# Patient Record
Sex: Male | Born: 1985 | Race: White | Hispanic: No | Marital: Single | Smoking: Current some day smoker
Health system: Southern US, Community
[De-identification: ages and names within clinical notes are randomized; demographics above are authoritative.]

## PROBLEM LIST (undated history)

## (undated) DIAGNOSIS — G5600 Carpal tunnel syndrome, unspecified upper limb: Secondary | ICD-10-CM

## (undated) DIAGNOSIS — M5412 Radiculopathy, cervical region: Secondary | ICD-10-CM

## (undated) DIAGNOSIS — M5416 Radiculopathy, lumbar region: Secondary | ICD-10-CM

## (undated) DIAGNOSIS — M549 Dorsalgia, unspecified: Secondary | ICD-10-CM

## (undated) HISTORY — DX: Carpal tunnel syndrome, unspecified upper limb: G56.00

## (undated) HISTORY — DX: Dorsalgia, unspecified: M54.9

## (undated) HISTORY — DX: Radiculopathy, lumbar region: M54.16

## (undated) HISTORY — DX: Radiculopathy, cervical region: M54.12

## (undated) HISTORY — PX: TONSILLECTOMY: SUR1361

---

## 2014-08-19 ENCOUNTER — Encounter (HOSPITAL_COMMUNITY): Payer: Self-pay | Admitting: Emergency Medicine

## 2014-08-19 ENCOUNTER — Emergency Department (HOSPITAL_COMMUNITY)
Admission: EM | Admit: 2014-08-19 | Discharge: 2014-08-19 | Disposition: A | Discharge status: Home / Self Care | Payer: BC Managed Care – PPO | Attending: Emergency Medicine | Admitting: Emergency Medicine

## 2014-08-19 DIAGNOSIS — H539 Unspecified visual disturbance: Secondary | ICD-10-CM | POA: Insufficient documentation

## 2014-08-19 DIAGNOSIS — R21 Rash and other nonspecific skin eruption: Secondary | ICD-10-CM | POA: Diagnosis not present

## 2014-08-19 DIAGNOSIS — Z720 Tobacco use: Secondary | ICD-10-CM | POA: Diagnosis not present

## 2014-08-19 DIAGNOSIS — R42 Dizziness and giddiness: Secondary | ICD-10-CM | POA: Insufficient documentation

## 2014-08-19 LAB — BASIC METABOLIC PANEL
Anion gap: 12 (ref 5–15)
BUN: 6 mg/dL (ref 6–23)
CO2: 25 meq/L (ref 19–32)
CREATININE: 0.65 mg/dL (ref 0.50–1.35)
Calcium: 8.9 mg/dL (ref 8.4–10.5)
Chloride: 102 mEq/L (ref 96–112)
GFR calc Af Amer: >90 mL/min (ref >90)
GFR calc non Af Amer: >90 mL/min (ref >90)
Glucose, Bld: 104 mg/dL — ABNORMAL HIGH (ref 70–99)
Potassium: 4 mEq/L (ref 3.7–5.3)
Sodium: 139 mEq/L (ref 137–147)

## 2014-08-19 LAB — CBC WITH DIFFERENTIAL/PLATELET
BASOS ABS: 0 10*3/uL (ref 0.0–0.1)
Basophils Relative: 1 % (ref 0–1)
Eosinophils Absolute: 0.2 10*3/uL (ref 0.0–0.7)
Eosinophils Relative: 2 % (ref 0–5)
HEMATOCRIT: 44.2 % (ref 39.0–52.0)
Hemoglobin: 14.8 g/dL (ref 13.0–17.0)
Lymphocytes Relative: 23 % (ref 12–46)
Lymphs Abs: 1.9 10*3/uL (ref 0.7–4.0)
MCH: 31 pg (ref 26.0–34.0)
MCHC: 33.5 g/dL (ref 30.0–36.0)
MCV: 92.5 fL (ref 78.0–100.0)
Monocytes Absolute: 0.6 10*3/uL (ref 0.1–1.0)
Monocytes Relative: 7 % (ref 3–12)
Neutro Abs: 5.6 10*3/uL (ref 1.7–7.7)
Neutrophils Relative %: 67 % (ref 43–77)
Platelets: 235 10*3/uL (ref 150–400)
RBC: 4.78 MIL/uL (ref 4.22–5.81)
RDW: 11.6 % (ref 11.5–15.5)
WBC: 8.4 10*3/uL (ref 4.0–10.5)

## 2014-08-19 LAB — URINALYSIS, ROUTINE W REFLEX MICROSCOPIC
Bilirubin Urine: NEGATIVE
GLUCOSE, UA: NEGATIVE mg/dL
KETONES UR: NEGATIVE mg/dL
Leukocytes, UA: NEGATIVE
Nitrite: NEGATIVE
Protein, ur: NEGATIVE mg/dL
Specific Gravity, Urine: 1.004 — ABNORMAL LOW (ref 1.005–1.030)
Urobilinogen, UA: 0.2 mg/dL (ref 0.0–1.0)
pH: 6 (ref 5.0–8.0)

## 2014-08-19 LAB — URINE MICROSCOPIC-ADD ON

## 2014-08-19 NOTE — ED Provider Notes (Signed)
CSN: [id number]     Arrival date & time 08/19/14  1811 History   First MD Initiated Contact with Patient 08/19/14 1936     Chief Complaint  Patient presents with  . Dizziness  . Rash  . Hypersensitivity Reaction     (Consider location/radiation/quality/duration/timing/severity/associated sxs/prior Treatment) HPI Kirk Williams is a 28 y.o. male with no significant past medical history who comes in complaining of "fatigue syndrome". Patient states that for the past 6 months he has been able to taste each one of his teeth individually and they all have a different taste. He reports not being able to think clearly. He reports the skin sometimes feels very warm. He does report he had a tooth removed 2 weeks ago and was concerned some of the filling material had enetered his blood stream and exacerbated his symptoms. He reports feeling dizzy and characterizes the feeling has "smoking marijuana or a really bad trip, like everything is out of focus". Says he has trouble reading signs that are far away if he can hold them close he has no problems reading. He also reports that his sense of smell and taste have increased tenfold. Denies any drug use other than cigarettes. Denies room spinning. Denies syncope, fevers, chest pain, shortness of breath, abdominal pain, N./V./C./D., numbness or weakness dysuria. He reports he has seen numerous doctors and none have been able to tell him exactly what the problem is. He reports having recently had [facility] CT [facility] [place] and was told the results were normal.   History reviewed. No pertinent past medical history. History reviewed. No pertinent past surgical history. History reviewed. No pertinent family history. History  Substance Use Topics  . Smoking status: Current Some Day Smoker  . Smokeless tobacco: Not on file  . Alcohol Use: No    Review of Systems  Constitutional: Negative for fever.  HENT: Negative for sore  throat.        Heightened sense of taste and smell  Eyes: Positive for visual disturbance.  Respiratory: Negative for shortness of breath.   Cardiovascular: Negative for chest pain.  Gastrointestinal: Negative for abdominal pain.  Endocrine: Negative for polyuria.  Genitourinary: Negative for dysuria.  Skin: Negative for rash.  Neurological: Positive for dizziness. Negative for headaches.      Allergies  Review of patient's allergies indicates no known allergies.  Home Medications   Prior to Admission medications   Not on File   BP 145/83  Pulse 88  Temp(Src) 98.3 F (36.8 C) (Oral)  Resp 16  SpO2 100% Physical Exam  Nursing note and vitals reviewed. Constitutional: He is oriented to person, place, and time. He appears well-developed and well-nourished.  HENT:  Head: Normocephalic and atraumatic.  Right Ear: External ear normal.  Left Ear: External ear normal.  Mouth/Throat: Oropharynx is clear and moist. No oropharyngeal exudate.  No evidence of dental infection. No fluctuance or evidence of a drainable abscess. No sign of Ludwig or Vincent angina   Eyes: Conjunctivae are normal. Pupils are equal, round, and reactive to light. Right eye exhibits no discharge. Left eye exhibits no discharge. No scleral icterus.  Neck: Normal range of motion. Neck supple. No thyromegaly present.  Cardiovascular: Normal rate, regular rhythm, normal heart sounds and intact distal pulses.  Exam reveals no gallop and no friction rub.   No murmur heard. Pulmonary/Chest: Effort normal and breath sounds normal. No respiratory distress. He has no wheezes. He has no rales.  Abdominal: Soft. He exhibits no distension and no mass. There is no tenderness. There is no rebound and no guarding.  Musculoskeletal: He exhibits no edema and no tenderness.  Lymphadenopathy:    He has no cervical adenopathy.  Neurological: He is alert and oriented to person, place, and time.  Cranial Nerves II-XII grossly  intact. No focal neuro deficits. Pt ambulates independently with no difficulty or ataxia. Mentation is appropriate with goal oriented speech.  Skin: Skin is warm and dry. No rash noted.  No rashes appreciated  Psychiatric: He has a normal mood and affect.  Pt seems anxious as to why no one can tell him what is going on.     ED Course  Procedures (including critical care time) Labs Review Labs Reviewed - No data to display  Imaging Review No results found.   EKG Interpretation None      MDM  Vitals stable - WNL -afebrile Pt resting comfortably in ED. PE not concerning for any acute or emergent pathology. Labwork essentially normal and noncontributory. EKG not concerning. Visual acuity is baseline and is not concerning. Symptomology likely has an anxiety component. No identifiable emergent cause for his current symptoms. Provided reassurance and encouraged patient to followup with primary care for other concerns. Pt reports good f/u with primary care in [place]. Discussed f/u with PCP and return precautions, pt very amenable to plan. Patient stable, in good condition and is appropriate for discharge. Prior to patient discharge I discussed patient presentation ED course with Dr. Loretha StaplerWofford   Final diagnoses:  Dizziness        Sharlene MottsBenjamin W Eliot Popper, PA-C 08/19/14 2353

## 2014-08-19 NOTE — Discharge Instructions (Signed)
You were evaluated in the ED today for your symptoms of dizziness, rashes and hypersensitivity. There were no emergent source is identified for your symptoms. He may followup with your primary care for any other concerns. If you begin to experience fevers, chest pain, shortness of breath, abdominal pain new numbness or weakness please return to ED for further evaluation. Dizziness Dizziness is a common problem. It is a feeling of unsteadiness or light-headedness. You may feel like you are about to faint. Dizziness can lead to injury if you stumble or fall. A person of any age group can suffer from dizziness, but dizziness is more common in older adults. CAUSES  Dizziness can be caused by many different things, including:  Middle ear problems.  Standing for too long.  Infections.  An allergic reaction.  Aging.  An emotional response to something, such as the sight of blood.  Side effects of medicines.  Tiredness.  Problems with circulation or blood pressure.  Excessive use of alcohol or medicines, or illegal drug use.  Breathing too fast (hyperventilation).  An irregular heart rhythm (arrhythmia).  A low red blood cell count (anemia).  Pregnancy.  Vomiting, diarrhea, fever, or other illnesses that cause body fluid loss (dehydration).  Diseases or conditions such as Parkinson's disease, high blood pressure (hypertension), diabetes, and thyroid problems.  Exposure to extreme heat. DIAGNOSIS  Your health care provider will ask about your symptoms, perform a physical exam, and perform an electrocardiogram (ECG) to record the electrical activity of your heart. Your health care provider may also perform other heart or blood tests to determine the cause of your dizziness. These may include:  Transthoracic echocardiogram (TTE). During echocardiography, sound waves are used to evaluate how blood flows through your heart.  Transesophageal echocardiogram (TEE).  Cardiac monitoring.  This allows your health care provider to monitor your heart rate and rhythm in real time.  Holter monitor. This is a portable device that records your heartbeat and can help diagnose heart arrhythmias. It allows your health care provider to track your heart activity for several days if needed.  Stress tests by exercise or by giving medicine that makes the heart beat faster. TREATMENT  Treatment of dizziness depends on the cause of your symptoms and can vary greatly. HOME CARE INSTRUCTIONS   Drink enough fluids to keep your urine clear or pale yellow. This is especially important in very hot weather. In older adults, it is also important in cold weather.  Take your medicine exactly as directed if your dizziness is caused by medicines. When taking blood pressure medicines, it is especially important to get up slowly.  Rise slowly from chairs and steady yourself until you feel okay.  In the morning, first sit up on the side of the bed. When you feel okay, stand slowly while holding onto something until you know your balance is fine.  Move your legs often if you need to stand in one place for a long time. Tighten and relax your muscles in your legs while standing.  Have someone stay with you for 1-2 days if dizziness continues to be a problem. Do this until you feel you are well enough to stay alone. Have the person call your health care provider if he or she notices changes in you that are concerning.  Do not drive or use heavy machinery if you feel dizzy.  Do not drink alcohol. SEEK IMMEDIATE MEDICAL CARE IF:   Your dizziness or light-headedness gets worse.  You feel nauseous or  vomit.  You have problems talking, walking, or using your arms, hands, or legs.  You feel weak.  You are not thinking clearly or you have trouble forming sentences. It may take a friend or family member to notice this.  You have chest pain, abdominal pain, shortness of breath, or sweating.  Your vision  changes.  You notice any bleeding.  You have side effects from medicine that seems to be getting worse rather than better. MAKE SURE YOU:   Understand these instructions.  Will watch your condition.  Will get help right away if you are not doing well or get worse. Document Released: 04/10/2001 Document Revised: 10/20/2013 Document Reviewed: 05/04/2011 [facility] Patient Information 2015 [employer], [place]. This information is not intended to replace advice given to you by your health care provider. Make sure you discuss any questions you have with your health care provider.    Emergency Department Resource Guide 1) Find a Doctor and Pay Out of Pocket Although you won't have to find out who is covered by your insurance plan, it is a good idea to ask around and get recommendations. You will then need to call the office and see if the doctor you have chosen will accept you as a new patient and what types of options they offer for patients who are self-pay. Some doctors offer discounts or will set up payment plans for their patients who do not have insurance, but you will need to ask so you aren't surprised when you get to your appointment.  2) Contact Your Local Health Department Not all health departments have doctors that can see patients for sick visits, but many do, so it is worth a call to see if yours does. If you don't know where your local health department is, you can check in your phone book. The CDC also has a tool to help you locate your state's health department, and many state websites also have listings of all of their local health departments.  3) Find a Walk-in Clinic If your illness is not likely to be very severe or complicated, you may want to try a walk in clinic. These are popping up all over the country in pharmacies, drugstores, and shopping centers. They're usually staffed by nurse practitioners or physician assistants that have been trained to treat common illnesses and  complaints. They're usually fairly quick and inexpensive. However, if you have serious medical issues or chronic medical problems, these are probably not your best option.  No Primary Care Doctor: - Call Health Connect at  [phone] - they can help you locate a primary care doctor that  accepts your insurance, provides certain services, etc. - Physician Referral Service- [phone]  Chronic Pain Problems: Organization         Address  Phone   Notes  [facility]  [phone] Patients need to be referred by their primary care doctor.   Medication Assistance: Organization         Address  Phone   Notes  [facility] Medication [facility] [address] [place] [place], [place] [address] [phone] --Must be a resident of [facility] -- Must have NO insurance coverage whatsoever (no Medicaid/ Medicare, etc.) -- The pt. MUST have a primary care doctor that directs their care regularly and follows them in the community   MedAssist  [phone]   [employer]  [phone]    Agencies that provide inexpensive medical care: Organization  Address  Phone   Notes  [place]  [phone]   [facility]    [phone]   [facility] [place], [place] [address] [phone]   [place] [address] [place] [place] [phone]   Planned Parenthood    [phone]   [facility]    [phone]   [place] [address], [place] Phone:  [phone], Fax:  [phone] Hours of Operation:  9 am - 6 pm, M-F.  Also accepts Medicaid/Medicare and self-pay.  [facility] for [address], [address], [place] Phone: [phone], Fax: [phone]. Hours of Operation:  8:30 am - 5:30 pm, M-F.  Also accepts Medicaid and self-pay.  [facility] [place] [address], [place] Phone: [phone]   [place], [place], [place] [phone], Ext. [phone] Mondays & Thursdays: 7-9 AM.  First 15 patients are seen on a first come, first serve basis.    [place] Providers:  Organization         Address  Phone   Notes  [facility] [address], [place] [phone] Also accepts self-pay patients.  [facility] [id number] [place], [place]  [phone]   [place], [address], [place] [phone]   [facility] [address] [place] [phone]   Lucianne Lei [address], [place]   [phone] Only accepts [place] Access [place] patients after they have their name applied to their card.   Self-Pay (no insurance) in [facility]:  Organization         Address  Phone   Notes  Sickle Cell Patients, [facility] [place] [phone]   [facility] [place] [phone]   [facility] [place]  [place], [place], [place] [phone]   [facility]/Dr. Osei-Bonsu  [address] [place] or [address], [place] [phone] Phone number for both [place] and [place] locations is the same.  Urgent Medical and [facility] [address], [place] [phone]   [facility] [address], [place] or [address] [phone]   [facility] [address], [place] [phone], phone; [phone], fax Sees patients 1st and 3rd Saturday of every month.  Must not qualify for public or private insurance (i.e. Medicaid, Medicare, [place] Health Choice, Veterans' Benefits)  Household income should be no more than 200% of the poverty level  The clinic cannot treat you if you are pregnant or think you are pregnant  Sexually transmitted diseases are not treated at the clinic.    Dental Care: Organization         Address  Phone  Notes  [facility] Department of [facility] [place] [phone] Accepts children up to age 57 who are enrolled in [place] or [place]; pregnant women with a Medicaid card; and children who have applied for Medicaid or [place] Health Choice, but were declined, whose parents can pay a reduced fee at time  of service.  [facility] Department of [facility]  [address], [place] [phone] Accepts children up to age 45 who are enrolled in [place] or [place]; pregnant women with a Medicaid card; and children who have applied for Medicaid or [place] Health Choice, but were declined, whose parents can pay a reduced fee at time of service.  [place] Adult Dental Access PROGRAM  [place] [phone] Patients are seen by appointment only. Walk-ins are not accepted. [place] will see patients 75 years of age and older. Monday - Tuesday (8am-5pm) Most Wednesdays (8:30-5pm) $30 per visit, cash only  [facility] Adult Dental Access PROGRAM  [address], [facility] [phone] Patients are seen by appointment only. Walk-ins are not accepted. [place] will see patients 10 years of age and older. One Wednesday Evening (Monthly: Volunteer Based).  $30 per visit, cash only  [place]  [phone] for adults; Children under age 74, call Graduate Pediatric Dentistry at [phone]. Children aged 101-14, please call [phone] to request a pediatric application.  Dental services are provided in all areas of dental care including fillings, crowns and bridges, complete and partial dentures, implants, gum treatment, root canals, and extractions. Preventive care is  also provided. Treatment is provided to both adults and children. Patients are selected via a lottery and there is often a waiting list.   [facility] [address] [place]  [phone] [email or web address]   [facility] [address] [place], [place] [phone], Ext. [phone] Second and Fourth Thursday of each month, opens at 6:30 AM; Clinic ends at 9 AM.  Patients are seen on a first-come first-served basis, and a limited number are seen during each clinic.   [facility]  [address] Hillard Danker [place], [place] [phone]   Eligibility Requirements You must have lived in [place], [place], or [place] counties for at least the last three months.   You cannot be eligible for state or federal sponsored [employer], including [employer], [place], or [employer].   You generally cannot be eligible for healthcare insurance through your employer.    How to apply: Eligibility screenings are held every Tuesday and Wednesday afternoon from 1:00 pm until 4:00 pm. You do not need an appointment for the interview!  [facility] [address] [place], [place]   [place]  [place] Department  [place]  [phone]    Behavioral Health Resources in the Community: Intensive Outpatient Programs Organization         Address  Phone  Notes  [place]. [address] [place], [place] [phone]   [facility] Outpatient [address], [place], [place]   ADS: Alcohol & Drug Svcs [address] [place], [place]   [place] [address]  [place], [place] or [phone]   Substance Abuse Resources Organization         Address  Phone  Notes  Alcohol and Drug Services  [phone]   [place]  [phone]   [facility] [place]   Chinita Pester  [phone]   Residential & Outpatient Substance Abuse Program  [phone]   Psychological Services Organization         Address  Phone  Notes  [facility]  [phone]- [phone]   [facility]  [phone]- [phone]   [facility] [address], [place] [phone] or [phone]    Mobile Crisis Teams Organization         Address  Phone  Notes  [facility], Mobile Crisis Care Unit  [phone]   [facility]  [address] [place], [place] [phone]   Doristine LocksSharon DeEsch [address] [place] [phone]    Self-Help/Support Groups Organization         Address  Phone             Notes  Mental Health Assoc. of [place] - variety of support groups  [phone] Call for more information  Narcotics Anonymous (NA), [facility] [address], [place]  2 meetings at this location   Statisticianesidential Treatment Programs Organization         Address  Phone  Notes  [facility] [address],    [place]  [phone]   [facility]  [address], [place] [phone], [place], [place] [phone]   [facility] [address], [place] [phone] Admissions: 8am-3pm M-F  [facility] [address]    [place], [place] [phone]   [facility] [address], [place], [place] [phone]   [facility] [address]  [place], [place] [phone]   [facility] - Intensive Outpatient [address], [place], [place] [phone]   [facility] ([facility] Recovery [facility] [address]  [place], [place] [phone] or [phone]   [facility] Services (RTS) [address] [place], [place] [phone] Accepts Medicaid  [facility] [address]  [place] [phone]  Substance Abuse/Addiction Treatment   [facility] Behavioral Health Resources Organization         Address  Phone  Notes  [facility]  [phone]) [phone]   Angie FavaJulie Brannon, PhD [address], [address] [place], [place]   [phone] or [phone]   [facility] [place]   [address] [place], [place] [phone]   [facility] [address], [place], [place] [phone] Insurance/Medicaid/sponsorship through [facility]  [facility] Families [address]                                    [place], [place] [phone] Therapy/tele-psych/case  [facility] [address]   [place], [place] [phone]    Dr. Lolly MustacheArfeen  [phone]   [facility] [place]  [employer] [facility] 1) [address], [place] 2) [address], [place] 3)  [address], [place] [phone]) [phone]  [phone]   [facility] Child Abuse Hotline [phone] or [phone](336) [phone] (After Hours)

## 2014-08-19 NOTE — ED Notes (Addendum)
Bizarre symptoms - fatique syndrome, taste teeth, dizzy spills, sometimes wanting to pass out. Gets confuse sometimes. Tooth extraction x 2 weeks ago. Lt. Lower back tooth extraction. Rash/welts, callas from ? Numbing medicine. Etc. Unexplained. Gets red blotches on body.  Went to pcp x 3 weeks ago for extensive work up.

## 2014-08-20 NOTE — ED Provider Notes (Signed)
Medical screening examination/treatment/procedure(s) were performed by non-physician practitioner and as supervising physician I was immediately available for consultation/collaboration.   EKG Interpretation None        Warnell Foresterrey Ovadia Lopp, MD 08/20/14 0003

## 2020-02-25 ENCOUNTER — Ambulatory Visit (INDEPENDENT_AMBULATORY_CARE_PROVIDER_SITE_OTHER): Payer: No Typology Code available for payment source | Admitting: Osteopathic Medicine

## 2020-02-25 ENCOUNTER — Other Ambulatory Visit: Payer: Self-pay

## 2020-02-25 ENCOUNTER — Encounter: Payer: Self-pay | Admitting: Osteopathic Medicine

## 2020-02-25 VITALS — BP 154/102 | HR 108 | Temp 98.1°F | Ht 70.0 in | Wt 176.0 lb

## 2020-02-25 DIAGNOSIS — R202 Paresthesia of skin: Secondary | ICD-10-CM | POA: Diagnosis not present

## 2020-02-25 DIAGNOSIS — M5410 Radiculopathy, site unspecified: Secondary | ICD-10-CM | POA: Diagnosis not present

## 2020-02-25 DIAGNOSIS — E559 Vitamin D deficiency, unspecified: Secondary | ICD-10-CM

## 2020-02-25 DIAGNOSIS — E538 Deficiency of other specified B group vitamins: Secondary | ICD-10-CM | POA: Diagnosis not present

## 2020-02-25 LAB — CBC
HCT: 46.9 % (ref 38.5–50.0)
Hemoglobin: 15.9 g/dL (ref 13.2–17.1)
MCH: 30.8 pg (ref 27.0–33.0)
MCHC: 33.9 g/dL (ref 32.0–36.0)
MCV: 90.9 fL (ref 80.0–100.0)
MPV: 12.8 fL — ABNORMAL HIGH (ref 7.5–12.5)
Platelets: 193 10*3/uL (ref 140–400)
RBC: 5.16 10*6/uL (ref 4.20–5.80)
RDW: 11.5 % (ref 11.0–15.0)
WBC: 7.8 10*3/uL (ref 3.8–10.8)

## 2020-02-25 LAB — VITAMIN B12: Vitamin B-12: 459 pg/mL (ref 200–1100)

## 2020-02-25 LAB — COMPLETE METABOLIC PANEL WITH GFR
AG Ratio: 2.3 (calc) (ref 1.0–2.5)
ALT: 28 U/L (ref 9–46)
AST: 18 U/L (ref 10–40)
Albumin: 4.5 g/dL (ref 3.6–5.1)
Alkaline phosphatase (APISO): 55 U/L (ref 36–130)
BUN: 8 mg/dL (ref 7–25)
CO2: 27 mmol/L (ref 20–32)
Calcium: 9.7 mg/dL (ref 8.6–10.3)
Chloride: 105 mmol/L (ref 98–110)
Creat: 0.85 mg/dL (ref 0.60–1.35)
GFR, Est African American: 130 mL/min/{1.73_m2} (ref >=60)
GFR, Est Non African American: 112 mL/min/{1.73_m2} (ref >=60)
Globulin: 2 g/dL (calc) (ref 1.9–3.7)
Glucose, Bld: 105 mg/dL — ABNORMAL HIGH (ref 65–99)
Potassium: 3.6 mmol/L (ref 3.5–5.3)
Sodium: 142 mmol/L (ref 135–146)
Total Bilirubin: 0.4 mg/dL (ref 0.2–1.2)
Total Protein: 6.5 g/dL (ref 6.1–8.1)

## 2020-02-25 LAB — TSH: TSH: 2.11 mIU/L (ref 0.40–4.50)

## 2020-02-25 LAB — VITAMIN D 25 HYDROXY (VIT D DEFICIENCY, FRACTURES): Vit D, 25-Hydroxy: 24 ng/mL — ABNORMAL LOW (ref 30–100)

## 2020-02-25 NOTE — Patient Instructions (Addendum)
   Will look through records and try to get a good summary of everything!   Consider second opinion from different neurologist - will await imaging records first so we have a better idea of this MS situation!  Dr. Karie Schwalbe can see you for trigger point injections. If this plus the tramadol is helping, great! If not, will get second opinion from spine specialist.    Will recheck labs today to follow up on B12 levels, since deficiency can cause some issues with numbness.

## 2020-02-25 NOTE — Progress Notes (Signed)
Kirk Williams is a 34 y.o. male who presents to  [place] at [facility]  today, 02/26/20, seeking care for the following: . NEW TO ESTABLISH  . MSK/Neuro history - need to review records. Pt has hx back pain, degenerative disc disease. Does really well with intermittent use of Tramadol and trigger point injections.      ASSESSMENT & PLAN with other pertinent history/findings:  The primary encounter diagnosis was Radiculopathy, unspecified spinal region. Diagnoses of Paresthesia, B12 deficiency, and Vitamin D deficiency were also pertinent to this visit.  Will review records - pt has concerns about previous inaccurate medical documentation. Care Everywhere reviewed for [facility] and [facility] records, MRI reports. Neuro records - pt brought paper copies, will need time to review these - some concern for MS, pt not on medication intervention for this so not sure what neurology plan/diagnosis truly is, would consider getting second opinion. I will review records and consult w/ Dr T - consider MRI T-spine, L-spine but this is non-urgent. OK to continue Tramadol prn.    Patient Instructions   Will look through records and try to get a good summary of everything!   Consider second opinion from different neurologist - will await imaging records first so we have a better idea of this MS situation!  Dr. Darene Lamer can see you for trigger point injections. If this plus the tramadol is helping, great! If not, will get second opinion from spine specialist.    Will recheck labs today to follow up on B12 levels, since deficiency can cause some issues with numbness.          Orders Placed This Encounter  Procedures  . CBC  . COMPLETE METABOLIC PANEL WITH GFR  . TSH  . Vitamin B12  . VITAMIN D 25 Hydroxy (Vit-D Deficiency, Fractures)    No orders of the defined types were placed in this encounter.      Follow-up instructions: Return for VISIT WITH SPORTS  MEDICINE FOR TRIGGER POINT INJECTIONS / OTHER. VISIT W/ DR. A 3 MOS TO FOLLOW-UP.                                         BP (!) 154/102 (BP Location: Left Arm, Patient Position: Sitting, Cuff Size: Normal)   Pulse (!) 108   Temp 98.1 F (36.7 C) (Oral)   Ht 5\' 10"  (1.778 m)   Wt 176 lb (79.8 kg)   BMI 25.25 kg/m   Current Meds  Medication Sig  . traMADol (ULTRAM) 50 MG tablet Take by mouth.    Results for orders placed or performed in visit on 02/25/20 (from the past 72 hour(s))  CBC     Status: Abnormal   Collection Time: 02/25/20 12:24 PM  Result Value Ref Range   WBC 7.8 3.8 - 10.8 Thousand/uL   RBC 5.16 4.20 - 5.80 Million/uL   Hemoglobin 15.9 13.2 - 17.1 g/dL   HCT 46.9 38.5 - 50.0 %   MCV 90.9 80.0 - 100.0 fL   MCH 30.8 27.0 - 33.0 pg   MCHC 33.9 32.0 - 36.0 g/dL   RDW 11.5 11.0 - 15.0 %   Platelets 193 140 - 400 Thousand/uL   MPV 12.8 (H) 7.5 - 12.5 fL  COMPLETE METABOLIC PANEL WITH GFR     Status: Abnormal   Collection Time: 02/25/20 12:24 PM  Result  Value Ref Range   Glucose, Bld 105 (H) 65 - 99 mg/dL    Comment: .            Fasting reference interval . For someone without known diabetes, a glucose value between 100 and 125 mg/dL is consistent with prediabetes and should be confirmed with a follow-up test. .    BUN 8 7 - 25 mg/dL   Creat 0.24 0.97 - 3.53 mg/dL   GFR, Est Non African American 112 > OR = 60 mL/min/1.105m2   GFR, Est African American 130 > OR = 60 mL/min/1.68m2   BUN/Creatinine Ratio NOT APPLICABLE 6 - 22 (calc)   Sodium 142 135 - 146 mmol/L   Potassium 3.6 3.5 - 5.3 mmol/L   Chloride 105 98 - 110 mmol/L   CO2 27 20 - 32 mmol/L   Calcium 9.7 8.6 - 10.3 mg/dL   Total Protein 6.5 6.1 - 8.1 g/dL   Albumin 4.5 3.6 - 5.1 g/dL   Globulin 2.0 1.9 - 3.7 g/dL (calc)   AG Ratio 2.3 1.0 - 2.5 (calc)   Total Bilirubin 0.4 0.2 - 1.2 mg/dL   Alkaline phosphatase (APISO) 55 36 - 130 U/L   AST 18 10 - 40 U/L    ALT 28 9 - 46 U/L  TSH     Status: None   Collection Time: 02/25/20 12:24 PM  Result Value Ref Range   TSH 2.11 0.40 - 4.50 mIU/L  Vitamin B12     Status: None   Collection Time: 02/25/20 12:24 PM  Result Value Ref Range   Vitamin B-12 459 200 - 1,100 pg/mL  VITAMIN D 25 Hydroxy (Vit-D Deficiency, Fractures)     Status: Abnormal   Collection Time: 02/25/20 12:24 PM  Result Value Ref Range   Vit D, 25-Hydroxy 24 (L) 30 - 100 ng/mL    Comment: Vitamin D Status         25-OH Vitamin D: . Deficiency:                    <20 ng/mL Insufficiency:             20 - 29 ng/mL Optimal:                 > or = 30 ng/mL . For 25-OH Vitamin D testing on patients on  D2-supplementation and patients for whom quantitation  of D2 and D3 fractions is required, the [employer](TM) 25-OH VIT D, (D2,D3), LC/MS/MS is recommended: order  code 29924 (patients >62yrs). See Note 1 . Note 1 . For additional information, please refer to  http://education.[facility].com/faq/FAQ199  (This link is being provided for informational/ educational purposes only.)     No results found.  Depression screen [facility] 2/9 02/25/2020  Decreased Interest 0  Down, Depressed, Hopeless 0  PHQ - 2 Score 0  Altered sleeping 0  Tired, decreased energy 1  Change in appetite 0  Feeling bad or failure about yourself  0  Trouble concentrating 0  Moving slowly or fidgety/restless 0  Suicidal thoughts 0  PHQ-9 Score 1  Difficult doing work/chores Somewhat difficult    GAD 7 : Generalized Anxiety Score 02/25/2020  Nervous, Anxious, on Edge 0  Control/stop worrying 0  Worry too much - different things 0  Trouble relaxing 0  Restless 0  Easily annoyed or irritable 0  Afraid - awful might happen 0  Total GAD 7 Score 0      All questions at time  of visit were answered - patient instructed to contact office with any additional concerns or updates.  ER/RTC precautions were reviewed with the patient.  Please note:  voice recognition software was used to produce this document, and typos may escape review. Please contact Dr. Lyn Hollingshead for any needed clarifications.   Total encounter time: 45 minutes.

## 2020-02-26 ENCOUNTER — Encounter: Payer: Self-pay | Admitting: Osteopathic Medicine

## 2020-02-26 NOTE — Telephone Encounter (Signed)
Just FYI to Dr A on my recommendations to patient

## 2020-02-26 NOTE — Telephone Encounter (Signed)
Probably, but I have not seen him, I do not know his story, so I will still defer this to Dr. Lyn Hollingshead.

## 2020-02-26 NOTE — Telephone Encounter (Signed)
Since he is seeing you and there seems to be a lot going on here.Kirk Williams are you OK if I advise patient to wait for your evaluation before we order MRI?

## 2020-03-02 ENCOUNTER — Encounter: Payer: Self-pay | Admitting: Sports Medicine

## 2020-03-02 ENCOUNTER — Ambulatory Visit (INDEPENDENT_AMBULATORY_CARE_PROVIDER_SITE_OTHER): Payer: No Typology Code available for payment source

## 2020-03-02 ENCOUNTER — Other Ambulatory Visit: Payer: Self-pay

## 2020-03-02 ENCOUNTER — Ambulatory Visit (INDEPENDENT_AMBULATORY_CARE_PROVIDER_SITE_OTHER): Payer: No Typology Code available for payment source | Admitting: Sports Medicine

## 2020-03-02 DIAGNOSIS — M5030 Other cervical disc degeneration, unspecified cervical region: Secondary | ICD-10-CM

## 2020-03-02 DIAGNOSIS — M5135 Other intervertebral disc degeneration, thoracolumbar region: Secondary | ICD-10-CM

## 2020-03-02 DIAGNOSIS — M5134 Other intervertebral disc degeneration, thoracic region: Secondary | ICD-10-CM | POA: Insufficient documentation

## 2020-03-02 IMAGING — DX DG CERVICAL SPINE COMPLETE 4+V
5 series · 5 of 5 positions shown · non-contrast
Comparison: None.

CLINICAL DATA: Pain

EXAM:
CERVICAL SPINE - COMPLETE 4+ VIEW

[c-spine lat]
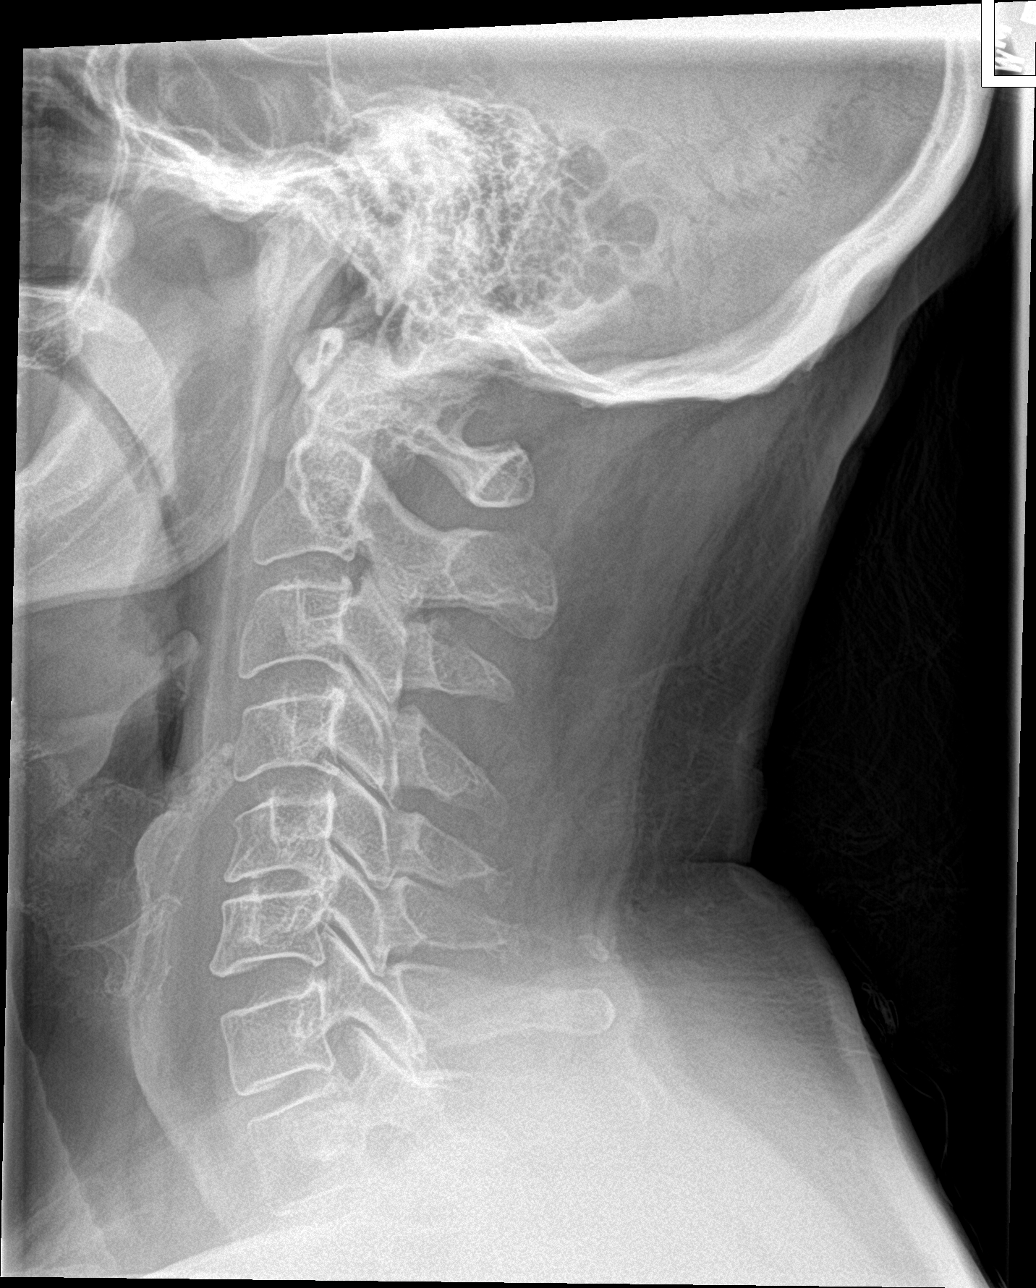

[c-spine obl (1 of 2)]
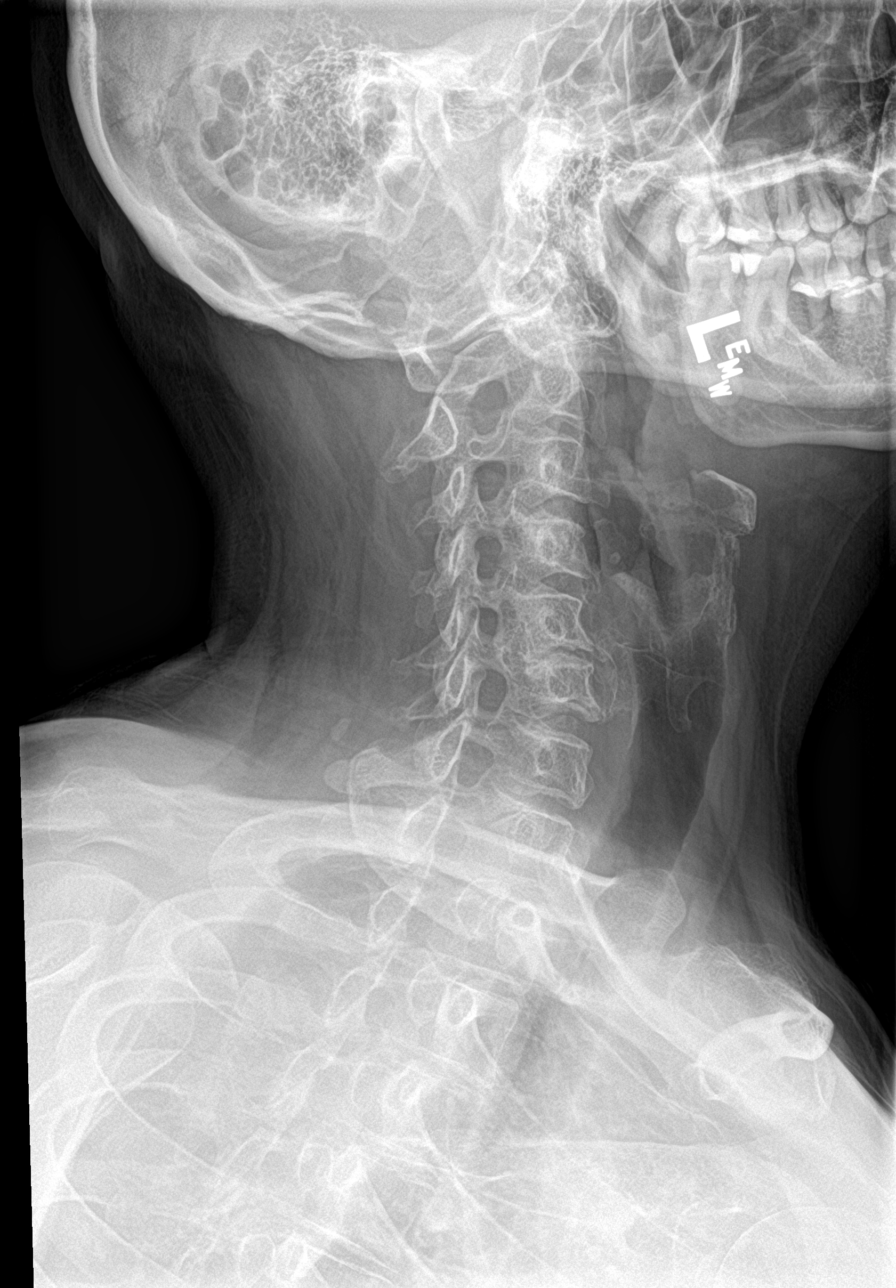

[c-spine obl (2 of 2)]
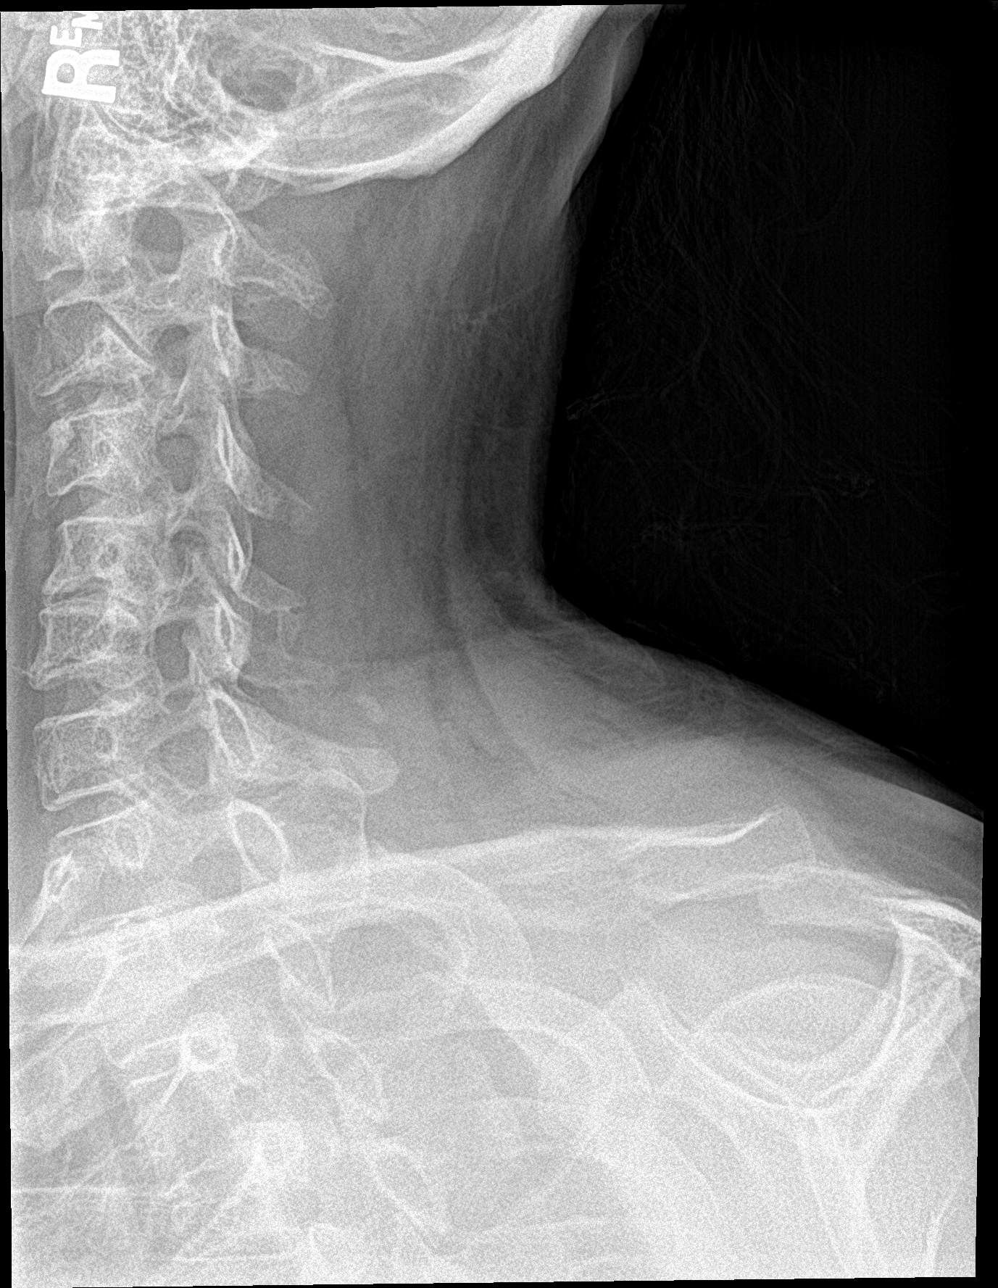

[c-spine ap]
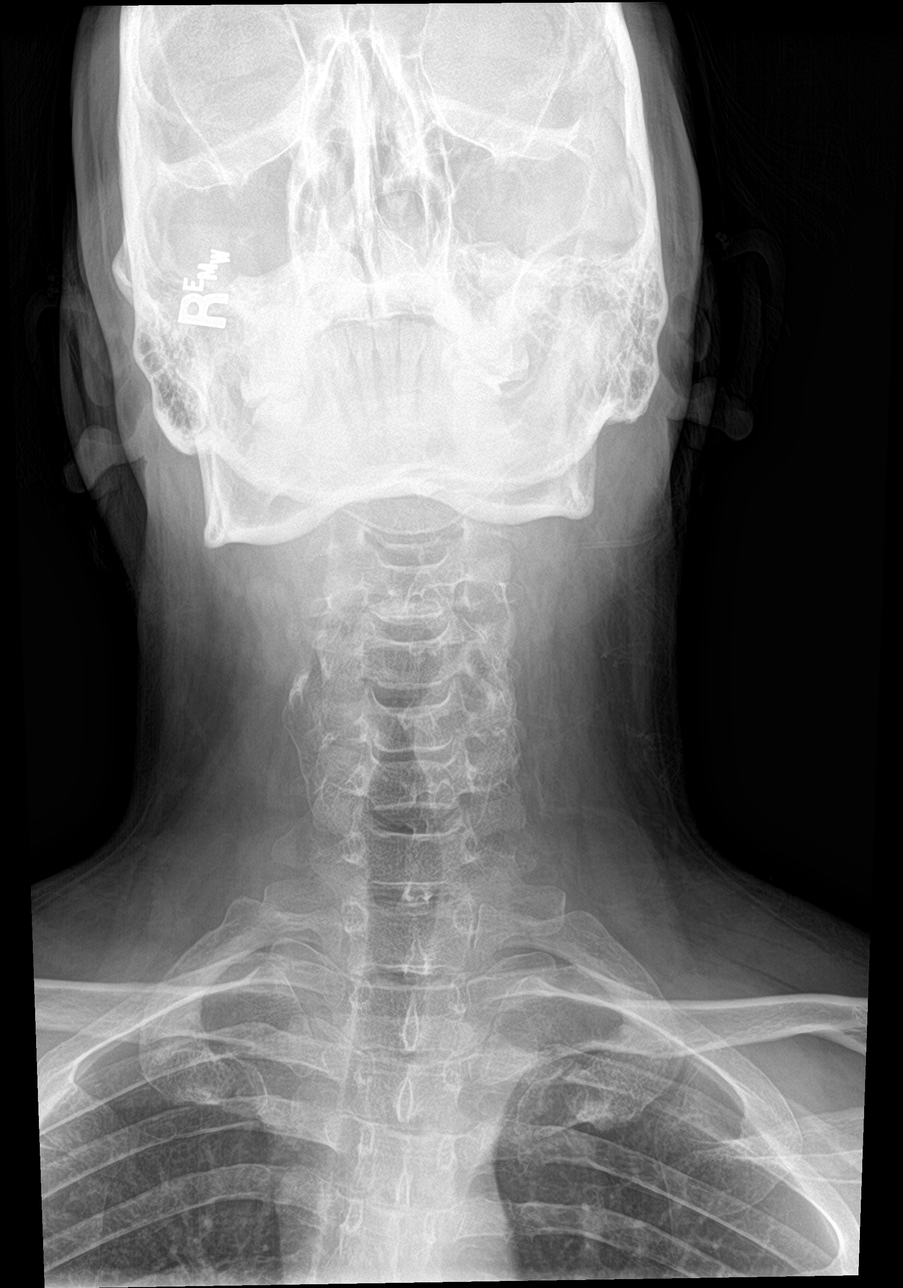

[c-spine open mouth]
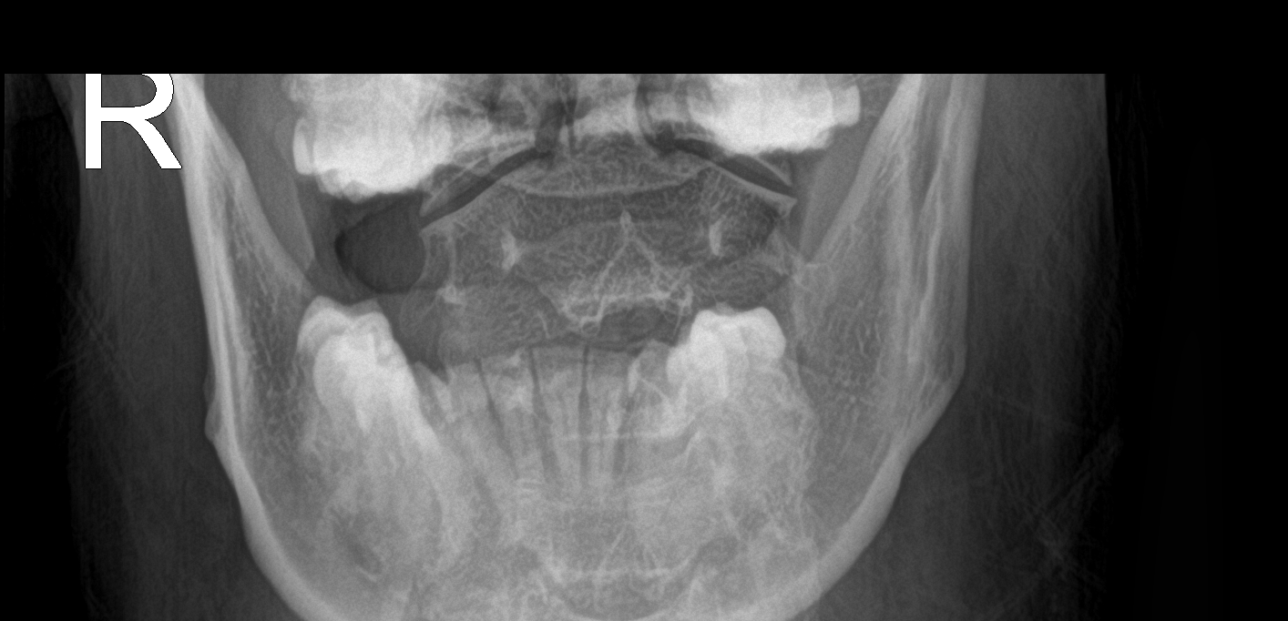

[5 of 5 positions shown; findings below may reference images not displayed]

FINDINGS: There is no prevertebral soft tissue swelling. No acute displaced
fracture. No significant malalignment. There are mild degenerative
changes throughout the cervical spine greatest at the C5-C6 level.
There appears to be at least mild bilateral osseous neural foraminal
narrowing at the C5-C6 level bilaterally.
IMPRESSION: 1. No acute osseous abnormality.
2. Mild degenerative changes as detailed above including probable at
least mild bilateral osseous neural foraminal narrowing at the C5-C6
level.

## 2020-03-02 IMAGING — DX DG THORACIC SPINE 3V
4 series · 4 of 4 positions shown · non-contrast
Comparison: None.

CLINICAL DATA: Chronic pain

EXAM:
THORACIC SPINE - 3 VIEWS

[t-spine ap]
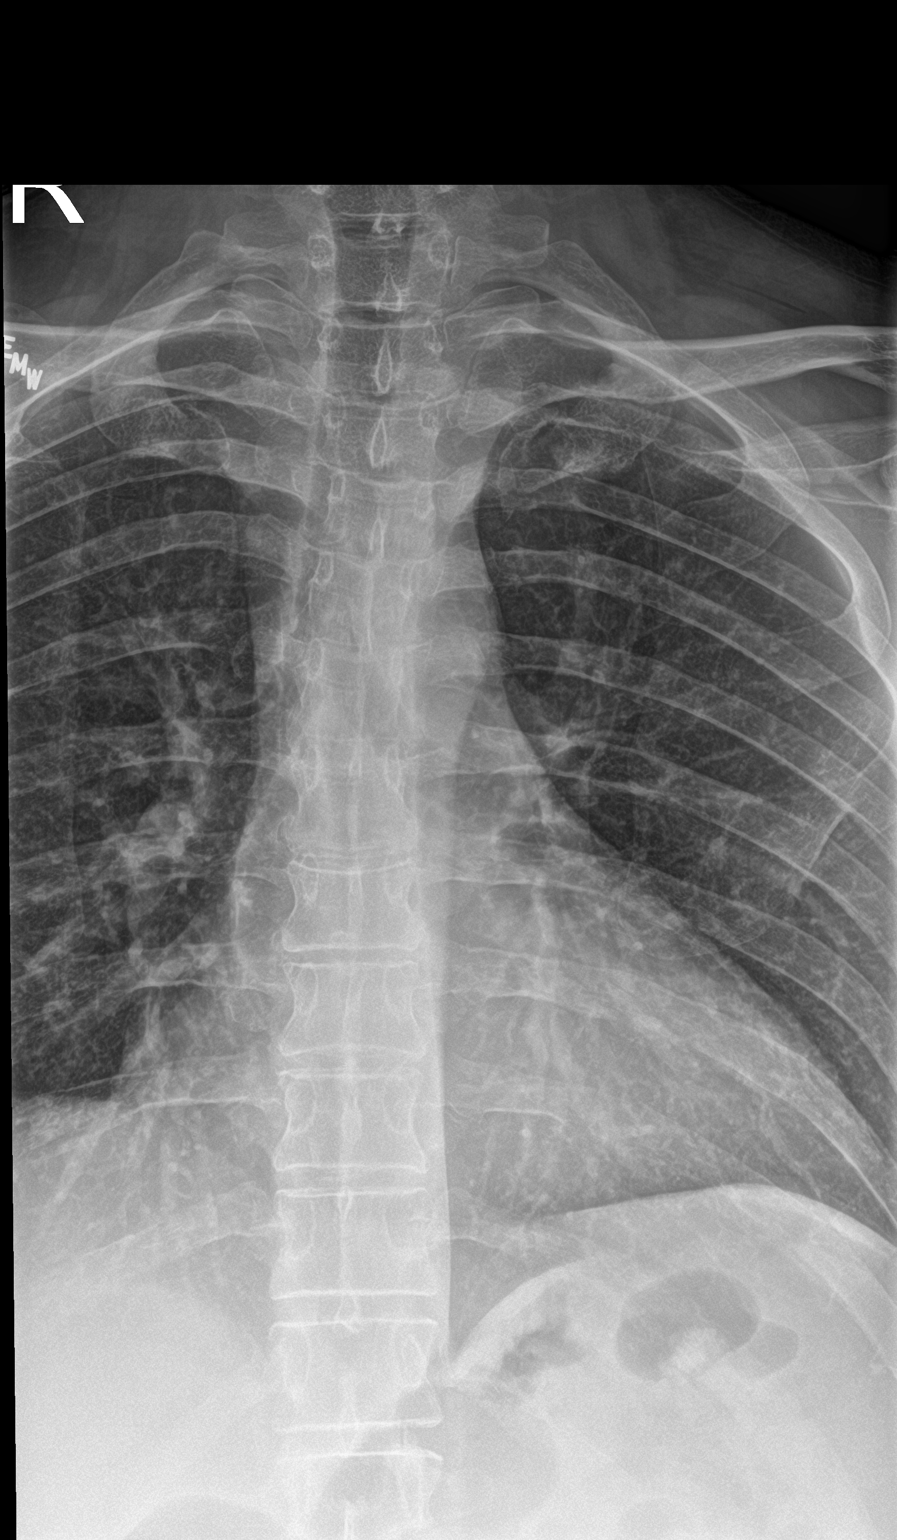

[t-spine lat]
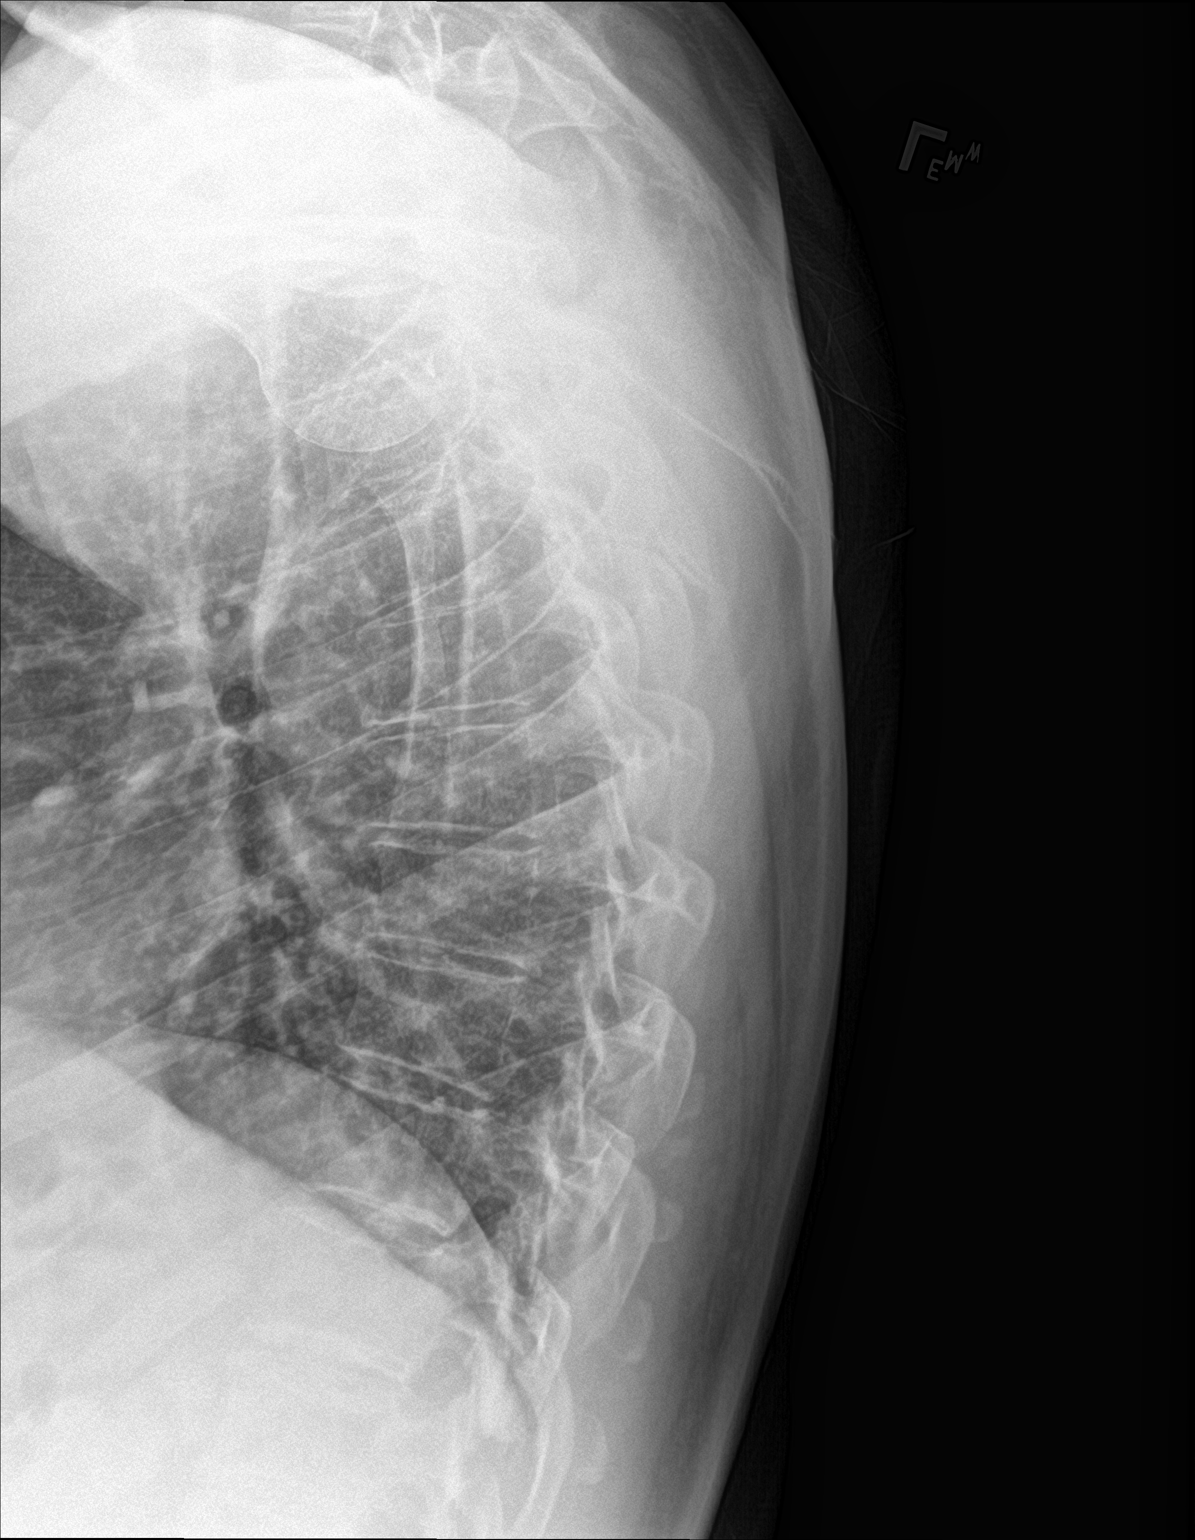

[t-spine swimmers (1 of 2)]
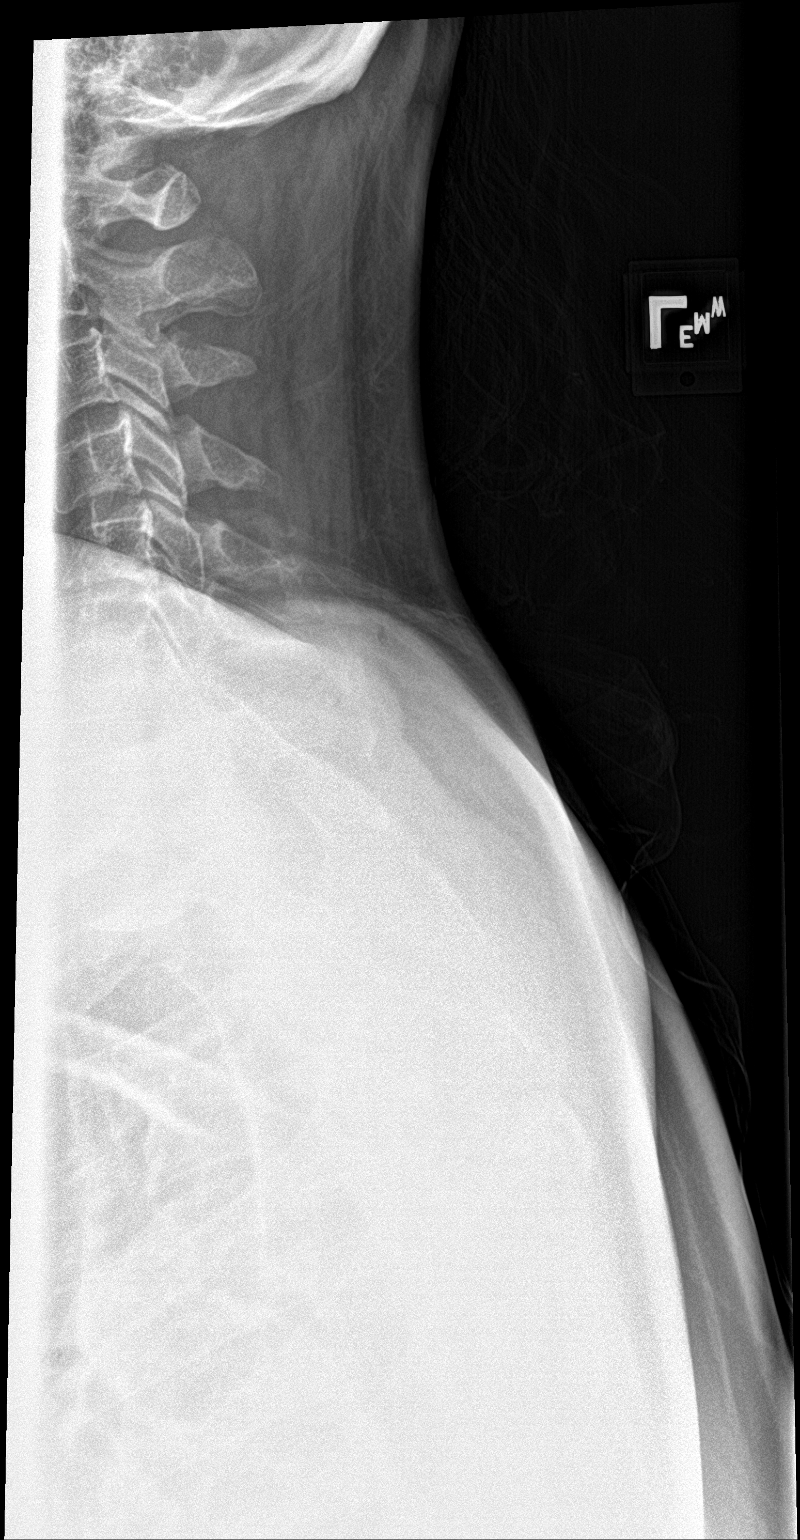

[t-spine swimmers (2 of 2)]
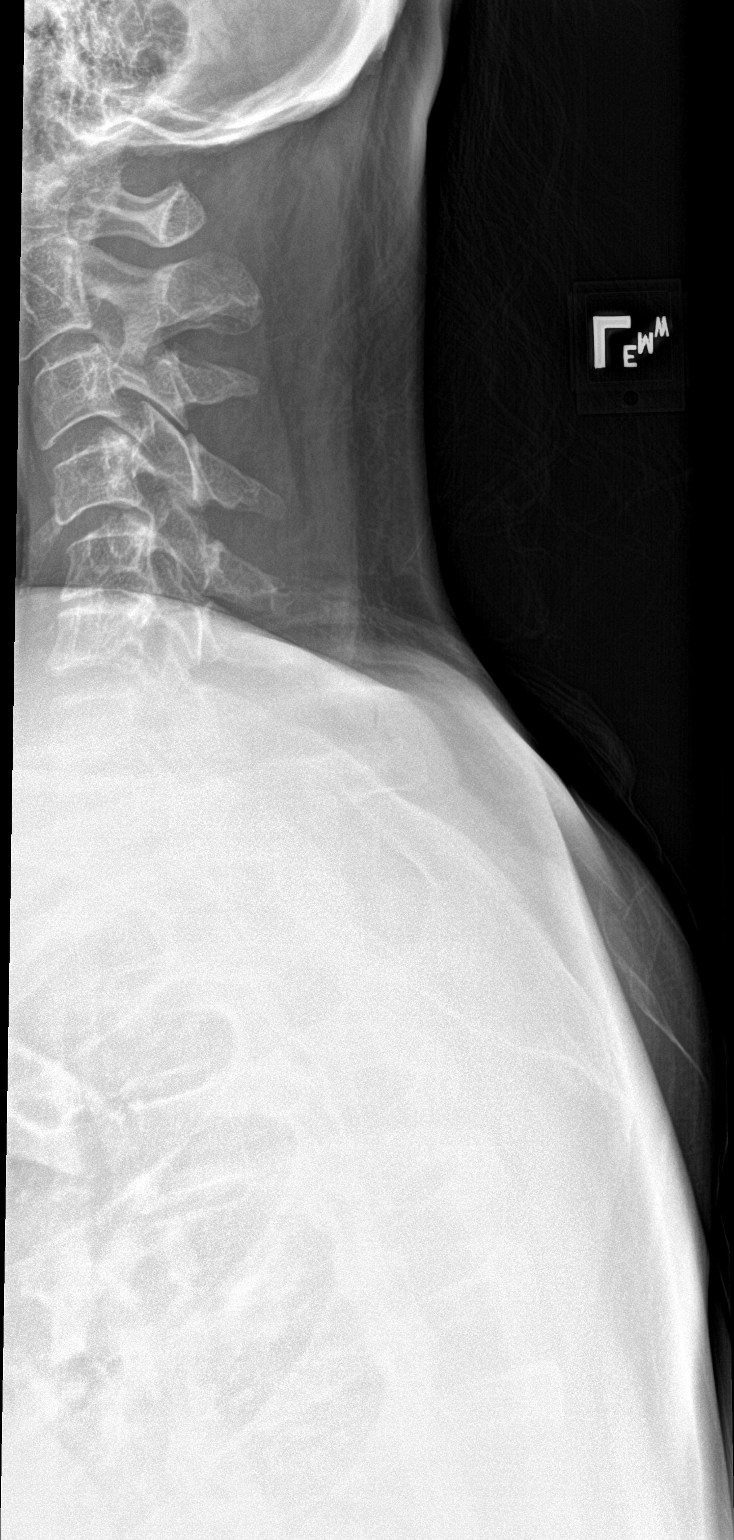

[4 of 4 positions shown; findings below may reference images not displayed]

FINDINGS: There is no evidence of thoracic spine fracture. Alignment is
normal. No other significant bone abnormalities are identified.
IMPRESSION: Negative.

## 2020-03-02 IMAGING — DX DG LUMBAR SPINE COMPLETE 4+V
5 series · 5 of 5 positions shown · non-contrast
Comparison: None.

CLINICAL DATA: Back pain

EXAM:
LUMBAR SPINE - COMPLETE 4+ VIEW

[l-spine ap]
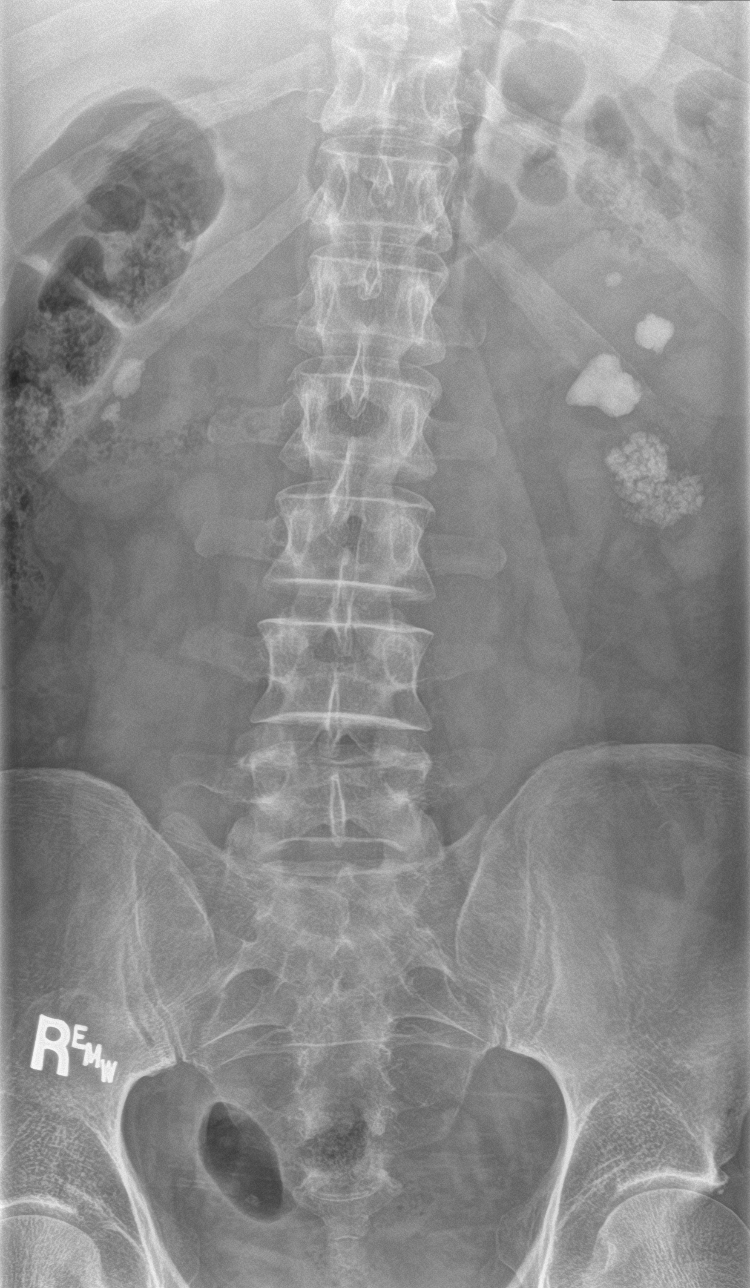

[l-spine obl (1 of 2)]
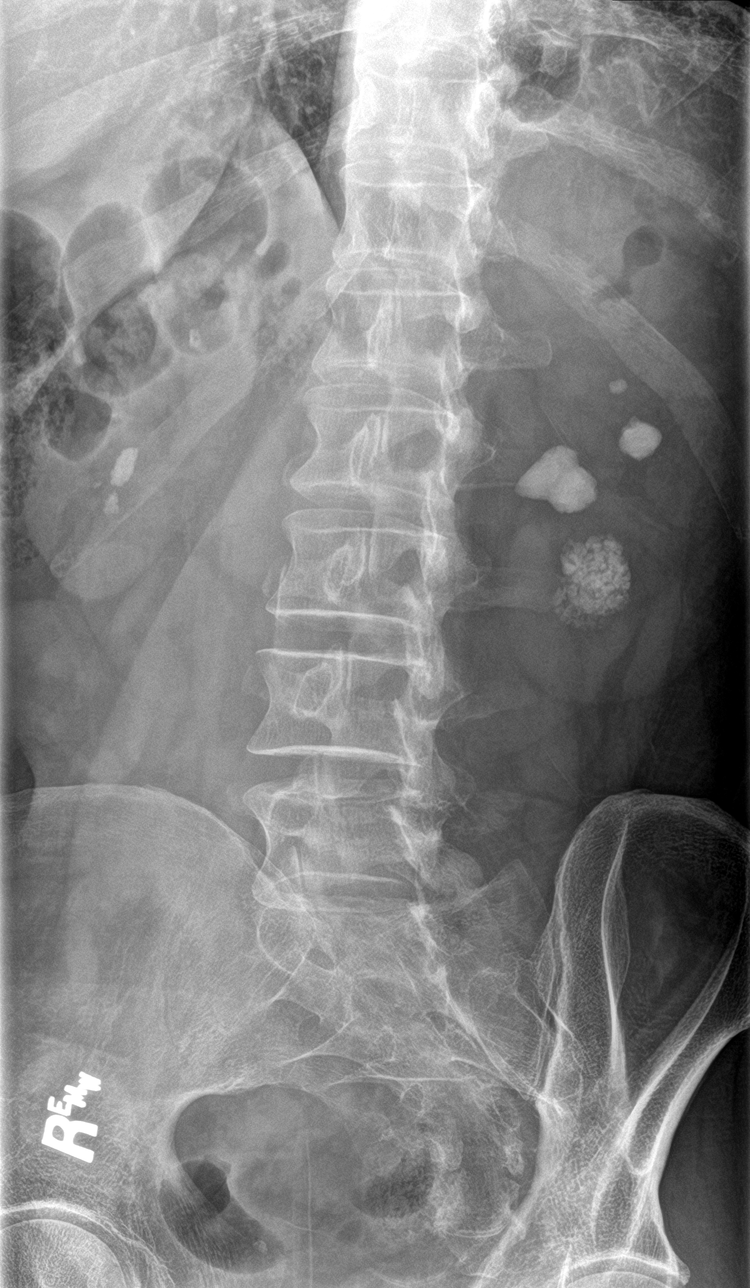

[l-spine obl (2 of 2)]
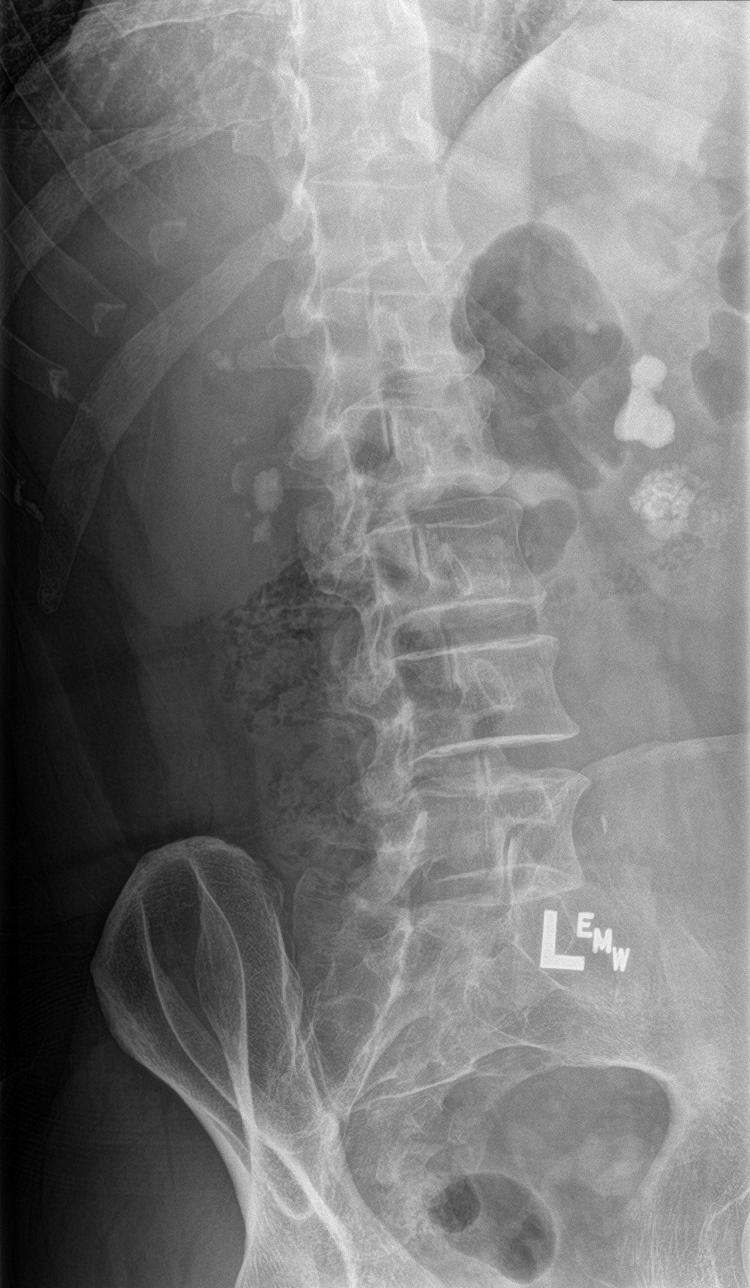

[l-spine lat]
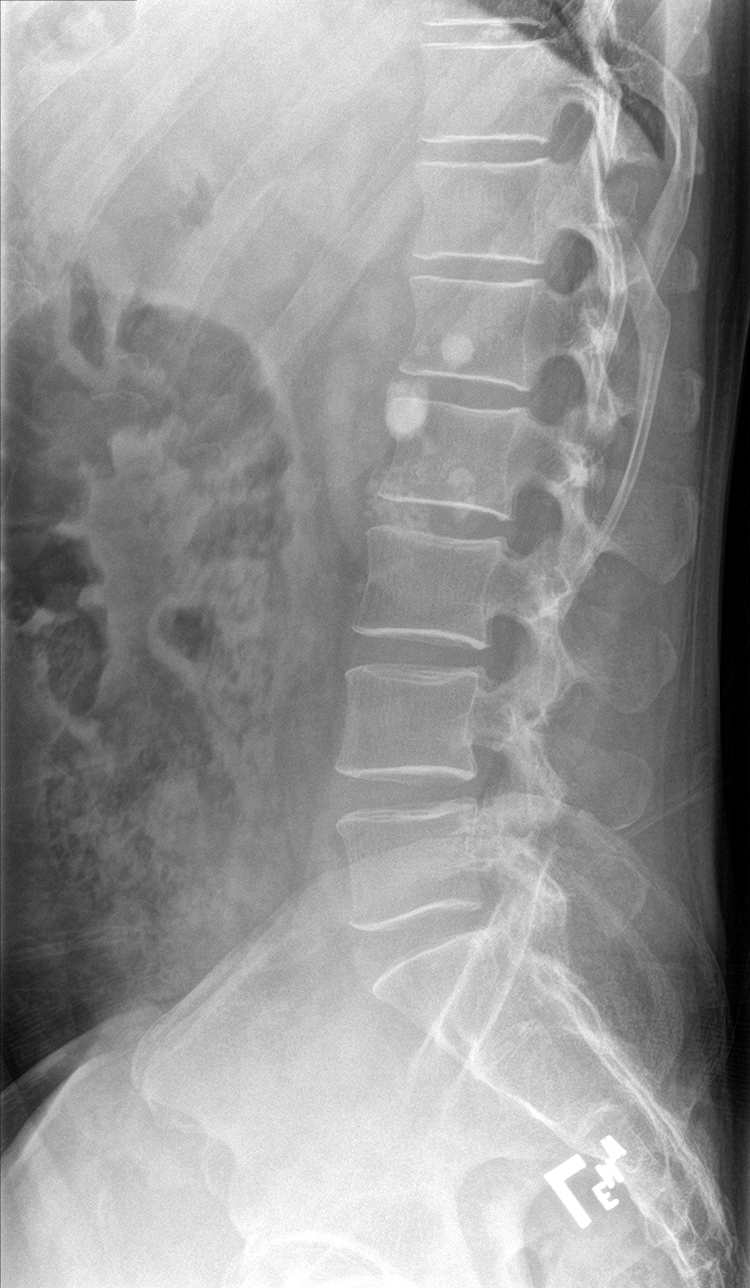

[l-spine spot]
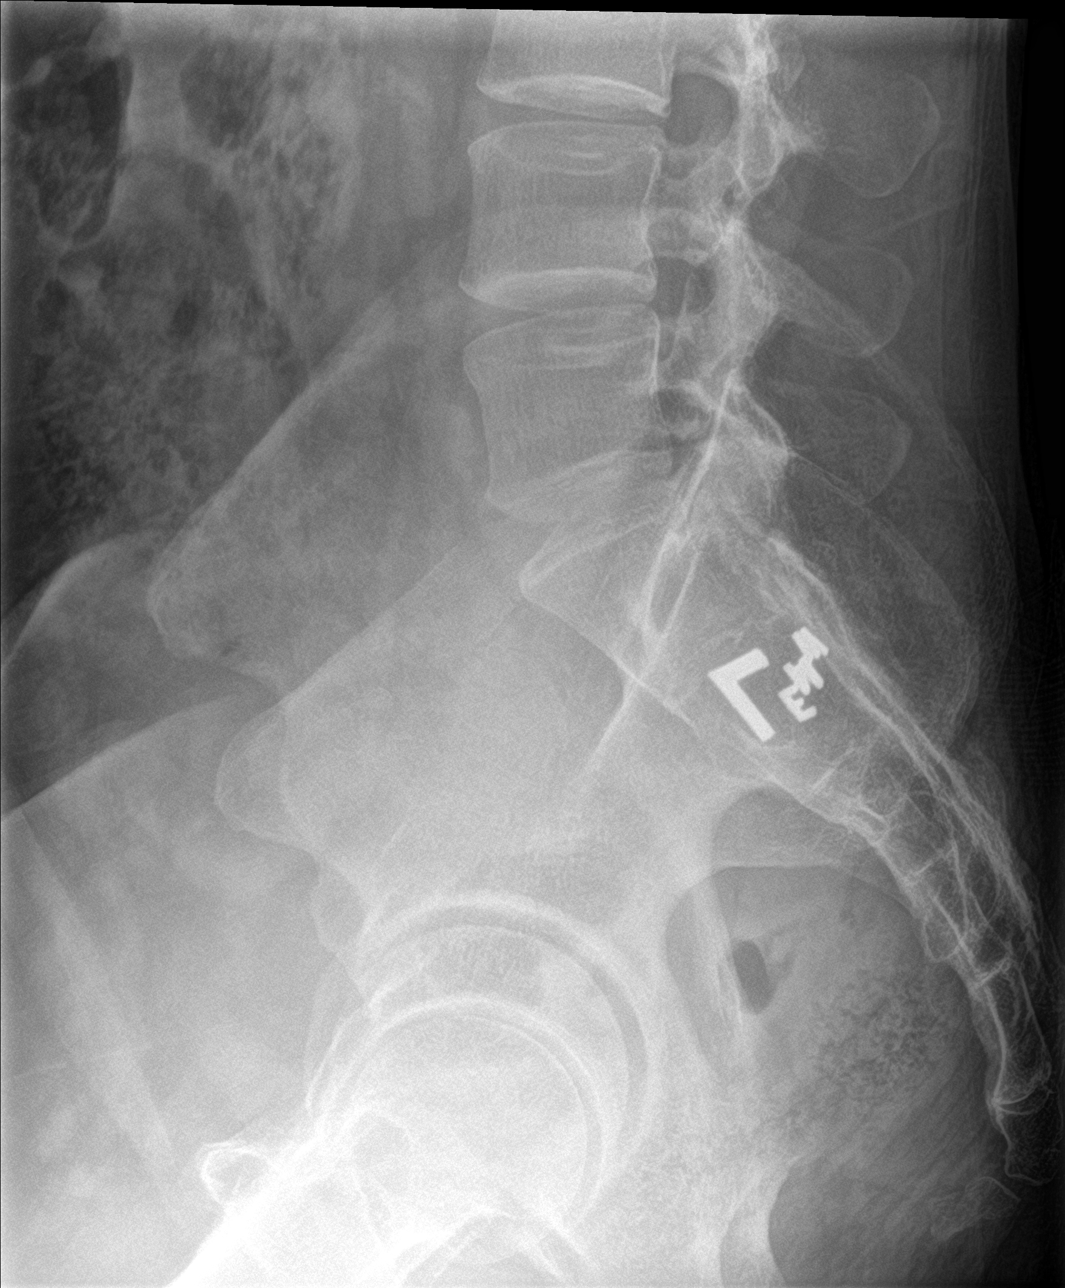

[5 of 5 positions shown; findings below may reference images not displayed]

FINDINGS: There are large bilateral kidney stones. On the right there is a
cm stone projecting over the lower pole. On the left, there is a
x 1.9 cm stone projecting over the renal pelvis. Additional large
bilateral renal nephroliths are noted.
IMPRESSION: 1. No acute osseous abnormality.
2. Minimal degenerative changes of the lumbar spine.
3. Large bilateral kidney stones are noted. Urologic consultation is
recommended.

## 2020-03-02 NOTE — Progress Notes (Signed)
    Procedures performed today:    Procedure:  Injection of paracervical and trapezial trigger points Consent obtained and verified. Time-out conducted. Noted no overlying erythema, induration, or other signs of local infection. Skin prepped in a sterile fashion. Topical analgesic spray: Ethyl chloride. Completed without difficulty. Meds: A total of 1 cc Kenalog 40 and 4 cc lidocaine spread out between the 4 trigger points. Pain immediately improved suggesting accurate placement of the medication. Advised to call if fevers/chills, erythema, induration, drainage, or persistent bleeding.  Procedure:  Injection of parathoracic muscle trigger point Consent obtained and verified. Time-out conducted. Noted no overlying erythema, induration, or other signs of local infection. Skin prepped in a sterile fashion. Topical analgesic spray: Ethyl chloride. Completed without difficulty. Meds: A total of 1 cc Depo-Medrol 40 and 4 cc lidocaine spread out between the 2 parathoracic trigger points. Pain immediately improved suggesting accurate placement of the medication. Advised to call if fevers/chills, erythema, induration, drainage, or persistent bleeding.  Independent interpretation of notes and tests performed by another provider:   None.  Brief History, Exam, Impression, and Recommendations:    DDD (degenerative disc disease), thoracolumbar This is a pleasant 34 year old male, he has chronic neck and back pain, he has been through multiple treatment modalities including physical therapy, NSAIDs, he has seen a neurologist, sports medicine providers in the past. Ultimately what has worked the best is trigger point injections, he does prefer Depo-Medrol in the parathoracolumbar muscles, and Kenalog in the paracervical muscles. Due to failure of conservative treatment we are going to proceed with x-rays of his thoracolumbar spine. I am also going to get an MRI of the thoracolumbar spine. I  performed trigger point injections in his parathoracic muscles today with Depo-Medrol.  DDD (degenerative disc disease), cervical This is a pleasant 34 year old male, he has chronic neck and back pain, he has been through multiple treatment modalities including physical therapy, NSAIDs, he has seen a neurologist, sports medicine providers in the past. Ultimately what has worked the best is trigger point injections, he does prefer Depo-Medrol in the parathoracolumbar muscles, and Kenalog in the paracervical muscles. Today I performed trigger point injections into his paracervical and trapezius muscles bilaterally with Kenalog.    ___________________________________________ Ihor Austin. Benjamin Stain, M.D., ABFM., CAQSM. Primary Care and Sports Medicine [facility]  Adjunct Instructor of Family Medicine  [employer]

## 2020-03-02 NOTE — Assessment & Plan Note (Signed)
This is a pleasant 34 year old male, he has chronic neck and back pain, he has been through multiple treatment modalities including physical therapy, NSAIDs, he has seen a neurologist, sports medicine providers in the past. Ultimately what has worked the best is trigger point injections, he does prefer Depo-Medrol in the parathoracolumbar muscles, and Kenalog in the paracervical muscles. Today I performed trigger point injections into his paracervical and trapezius muscles bilaterally with Kenalog.

## 2020-03-02 NOTE — Assessment & Plan Note (Signed)
This is a pleasant 34 year old male, he has chronic neck and back pain, he has been through multiple treatment modalities including physical therapy, NSAIDs, he has seen a neurologist, sports medicine providers in the past. Ultimately what has worked the best is trigger point injections, he does prefer Depo-Medrol in the parathoracolumbar muscles, and Kenalog in the paracervical muscles. Due to failure of conservative treatment we are going to proceed with x-rays of his thoracolumbar spine. I am also going to get an MRI of the thoracolumbar spine. I performed trigger point injections in his parathoracic muscles today with Depo-Medrol.

## 2020-03-05 ENCOUNTER — Ambulatory Visit (INDEPENDENT_AMBULATORY_CARE_PROVIDER_SITE_OTHER): Payer: No Typology Code available for payment source

## 2020-03-05 ENCOUNTER — Other Ambulatory Visit: Payer: Self-pay

## 2020-03-05 DIAGNOSIS — M5135 Other intervertebral disc degeneration, thoracolumbar region: Secondary | ICD-10-CM

## 2020-03-05 IMAGING — MR MR LUMBAR SPINE W/O CM
4 of 5 series · 26 of 48 positions shown · non-contrast
Comparison: Prior radiograph from [DATE].

CLINICAL DATA: Initial evaluation for chronic low back pain with
bilateral lower extremity radiculopathy.

EXAM:
MRI LUMBAR SPINE WITHOUT CONTRAST
TECHNIQUE: Multiplanar, multisequence MR imaging of the lumbar spine was
performed. No intravenous contrast was administered.

[Series 2: T2 · sagittal · 4.0mm · 0.81mm/px · 5 of 15 slices shown (1 of 2)]
[im 1/15]
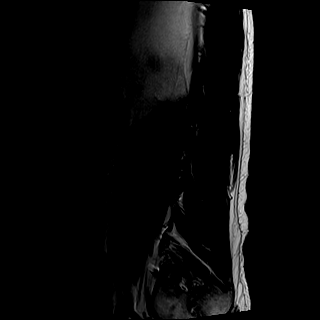
[im 4/15]
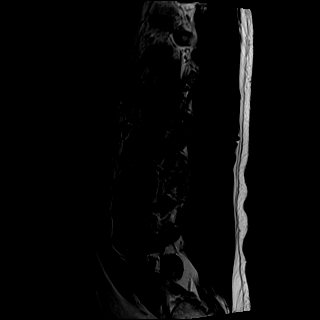
[im 8/15]
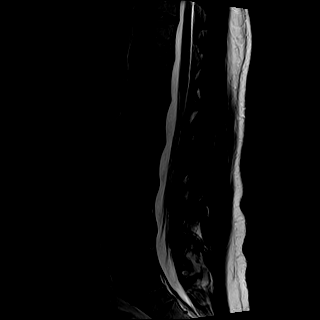
[im 11/15]
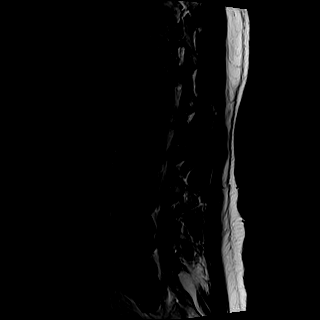
[im 15/15]
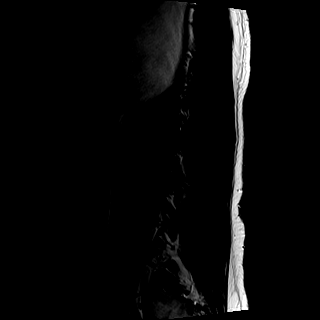

[Series 3: T1 · sagittal · 4.0mm · 0.41mm/px · 6 of 15 slices shown (1 of 2)]
[im 1/15]
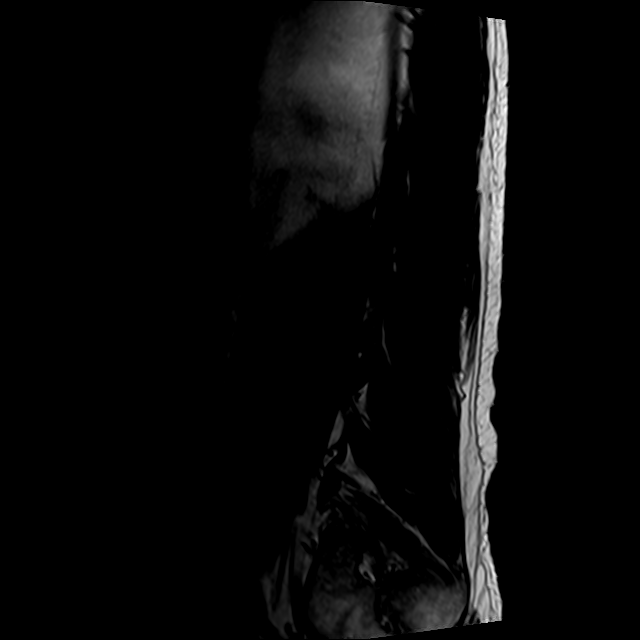
[im 3/15]
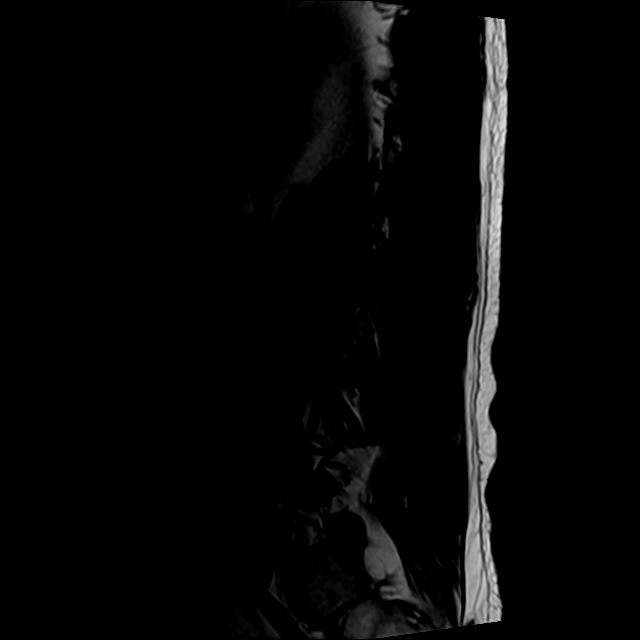
[im 6/15]
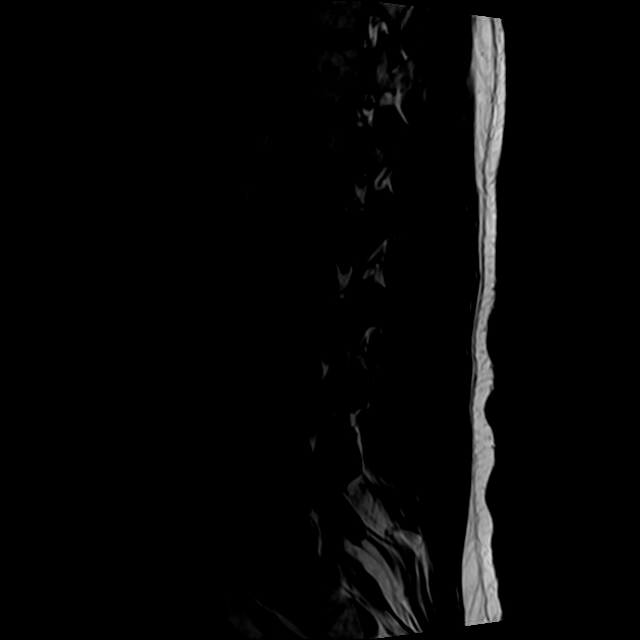
[im 9/15]
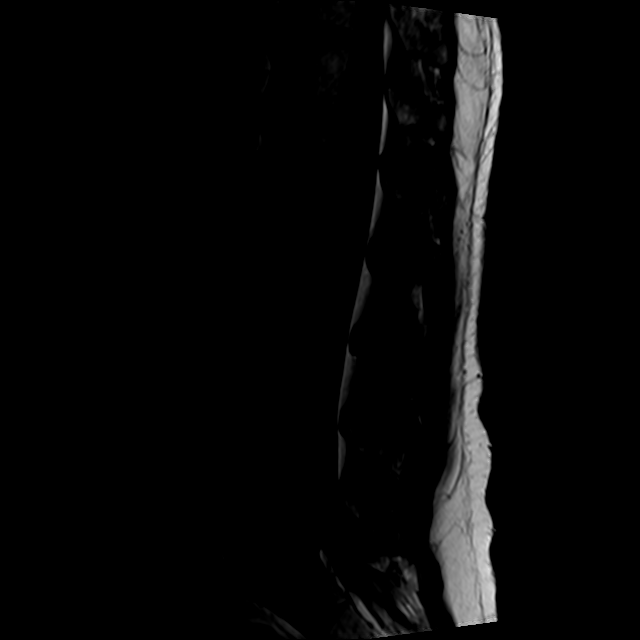
[im 12/15]
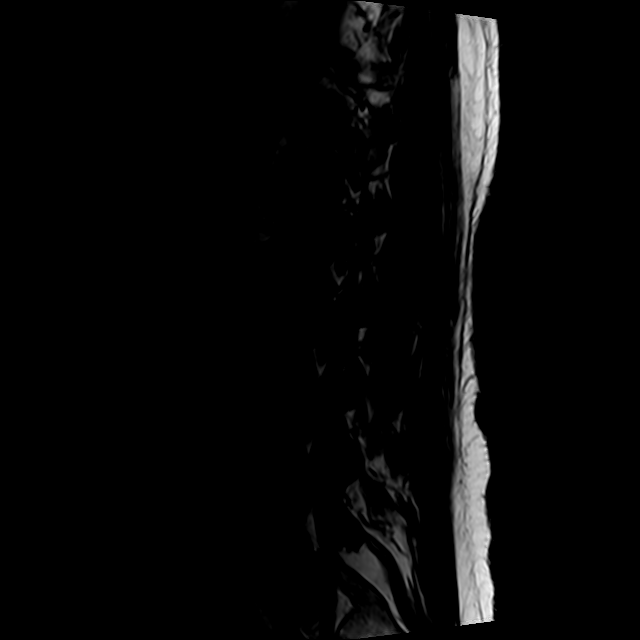
[im 15/15]
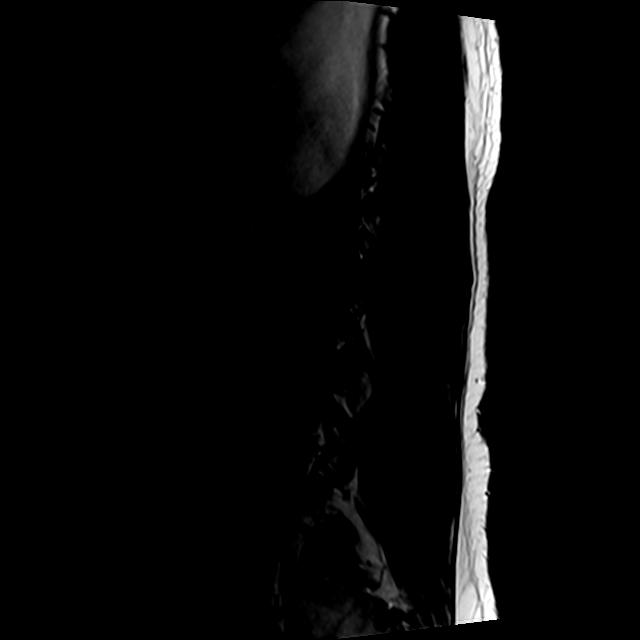

[Series 5: T2 · axial · 4.0mm · 0.78mm/px · z∈[-107,+99]mm · 10 of 41 slices shown (2 of 2)]
[im 3/41]
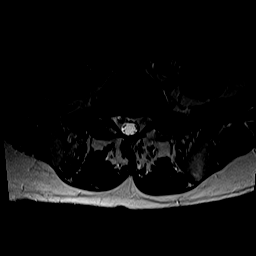
[im 6/41]
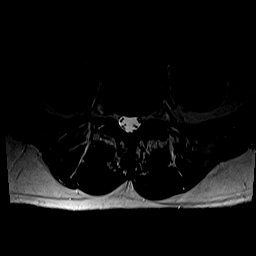
[im 9/41]
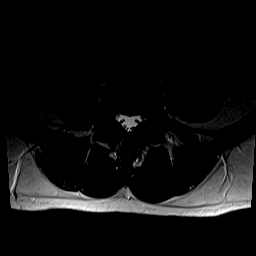
[im 14/41]
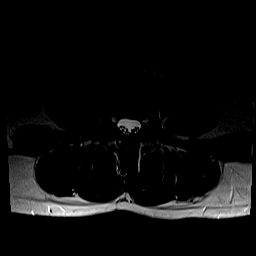
[im 19/41]
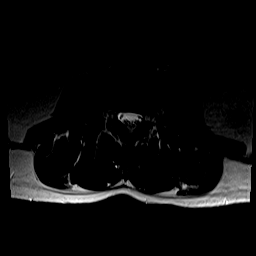
[im 22/41]
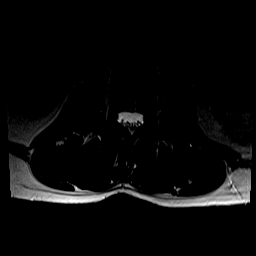
[im 25/41]
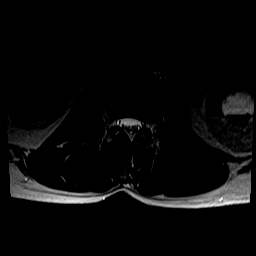
[im 30/41]
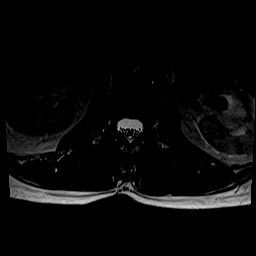
[im 35/41]
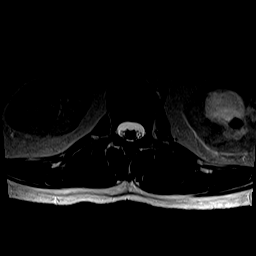
[im 41/41]
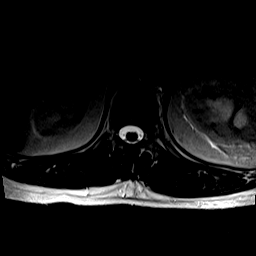

[Series 6: T1 · axial · 4.0mm · 0.39mm/px · z∈[-107,+69]mm · 5 of 41 slices shown (2 of 2)]
[im 3/41]
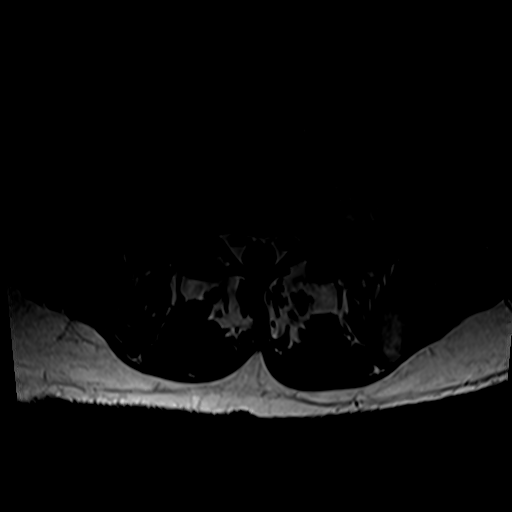
[im 6/41]
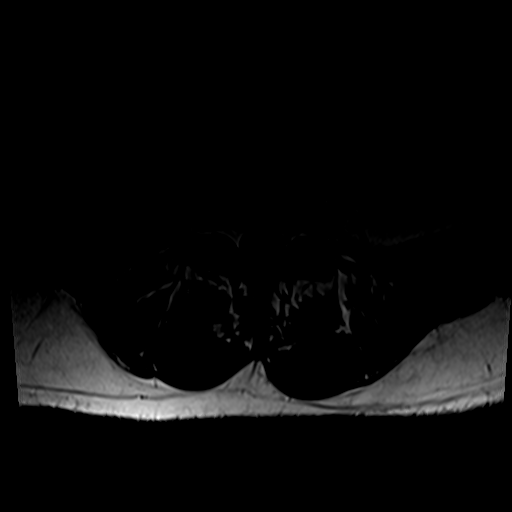
[im 9/41]
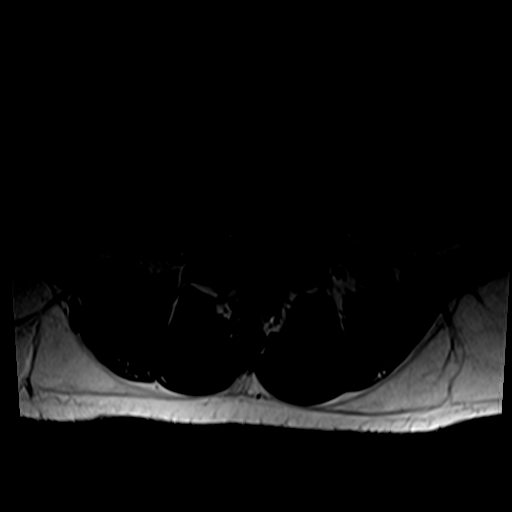
[im 22/41]
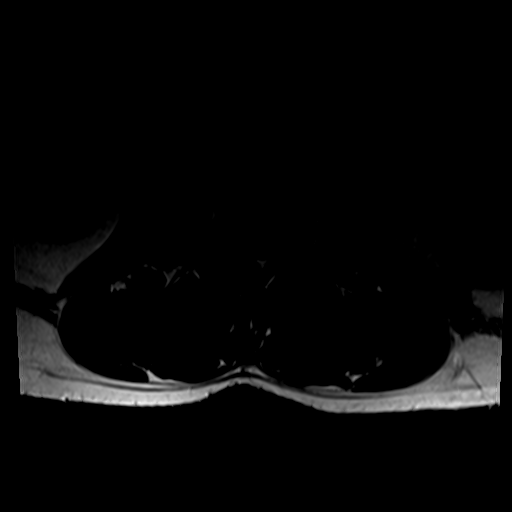
[im 35/41]
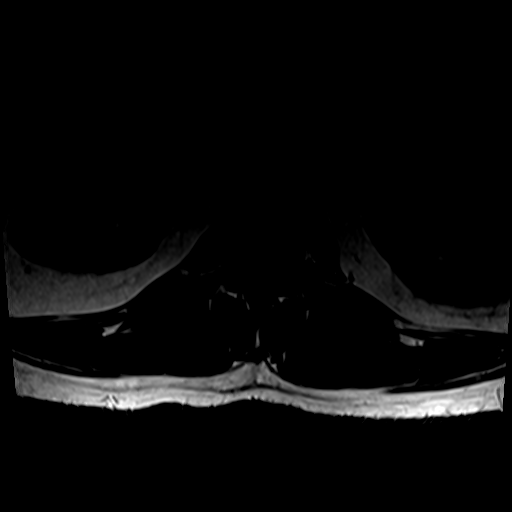

[26 of 48 positions shown; findings below may reference images not displayed]

FINDINGS: Segmentation: Standard. Lowest well-formed disc space labeled the
L5-S1 level.

Alignment: Physiologic with preservation of the normal lumbar
lordosis. No listhesis.

Vertebrae: Vertebral body height well maintained without evidence
for acute or chronic fracture. Bone marrow signal intensity within
normal limits. No discrete or worrisome osseous lesions. No abnormal
marrow edema.

Conus medullaris and cauda equina: Conus extends to the L1-2 level.
Conus and cauda equina appear normal.

Paraspinal and other soft tissues: Paraspinous soft tissues within
normal limits. Multiple cysts versus calyceal dilatation noted
within the left kidney, with probable superimposed multiple calculi,
largest of which measures approximately 2 cm. Probable
nonobstructive stones noted within the right kidney as well.

Disc levels:

L1-2:  Unremarkable.

L2-3:  Unremarkable.

L3-4:  Unremarkable.

L4-5: Normal interspace. Mild facet and ligament flavum hypertrophy.
No significant spinal stenosis. Foramina remain patent.

L5-S1: Normal interspace. Mild bilateral facet hypertrophy. No canal
or foraminal stenosis.
IMPRESSION: 1. Mild bilateral facet hypertrophy at L4-5 and L5-S1 without
stenosis.
2. Otherwise essentially normal MRI of the lumbar spine. No
significant disc pathology, stenosis, or evidence for neural
impingement.
3. Bilateral nephrolithiasis with multiple cysts versus calyceal
dilatation within the left kidney. Urologic consultation and
referral suggested for further evaluation.

## 2020-03-05 IMAGING — MR MR THORACIC SPINE W/O CM
5 series · 40 of 48 positions shown · non-contrast
Comparison: Prior radiograph from [DATE].

CLINICAL DATA: Initial evaluation for chronic mid thoracic back
pain with bilateral leg and arm radiculopathy.

EXAM:
MRI THORACIC SPINE WITHOUT CONTRAST
TECHNIQUE: Multiplanar, multisequence MR imaging of the thoracic spine was
performed. No intravenous contrast was administered.

[Series 5: T2 · sagittal · 3.0mm · 1.00mm/px · 6 of 15 slices shown (1 of 2)]
[im 1/15]
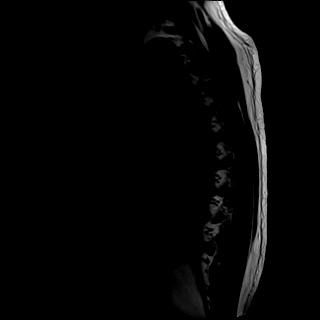
[im 3/15]
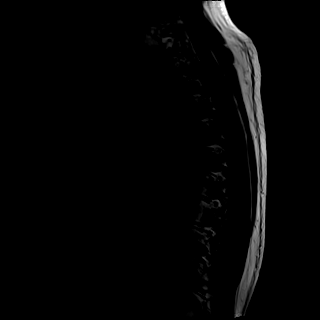
[im 6/15]
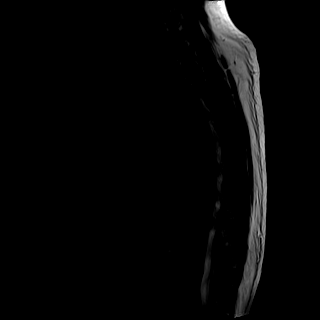
[im 9/15]
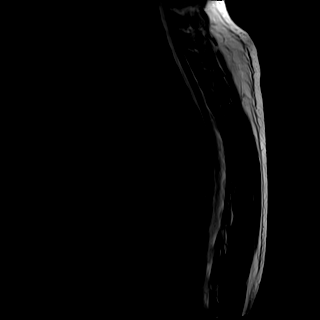
[im 12/15]
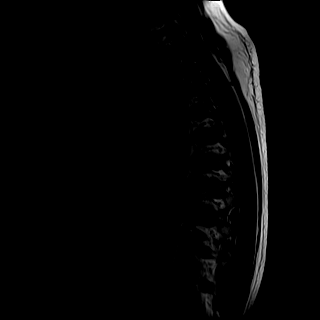
[im 15/15]
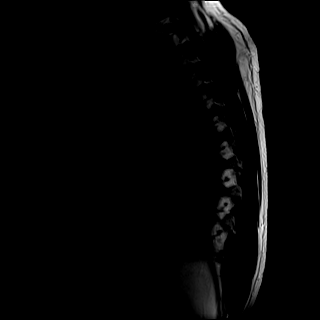

[Series 6: T1 · sagittal · 3.0mm · 1.00mm/px · 6 of 15 slices shown]
[im 1/15]
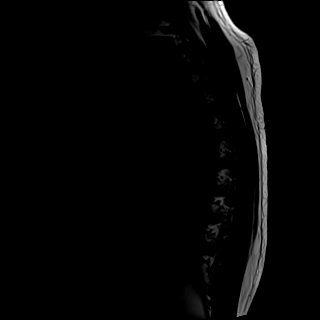
[im 3/15]
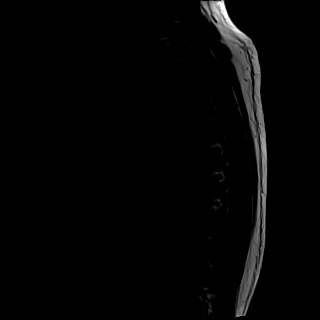
[im 6/15]
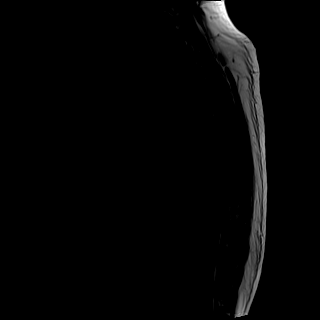
[im 9/15]
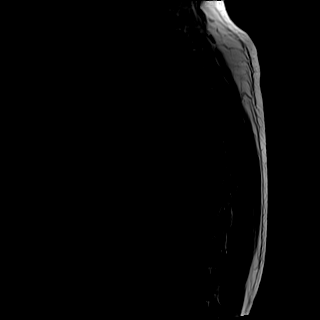
[im 12/15]
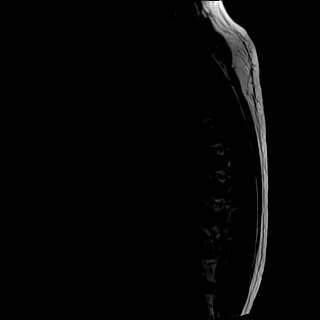
[im 15/15]
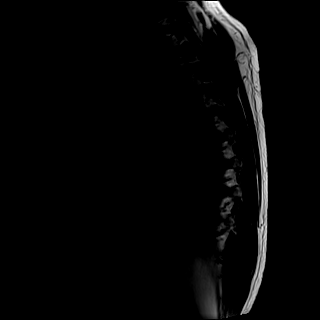

[Series 7: STIR · sagittal · 3.0mm · 1.00mm/px · 6 of 15 slices shown]
[im 1/15]
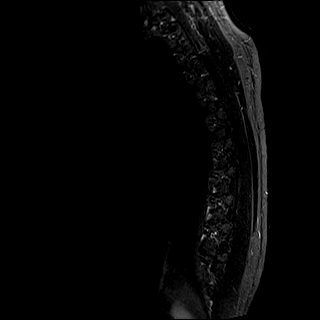
[im 3/15]
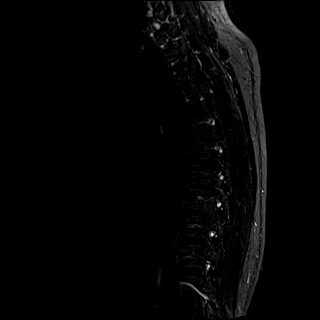
[im 6/15]
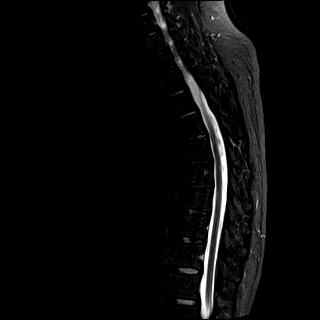
[im 9/15]
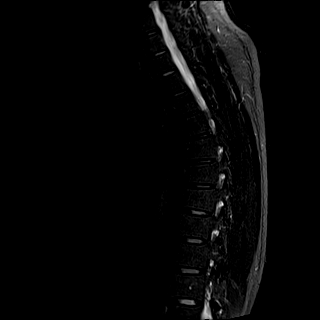
[im 12/15]
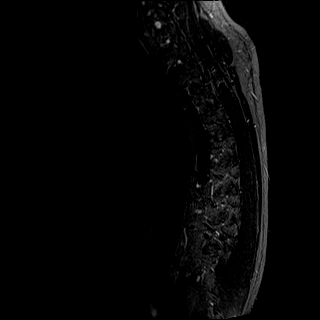
[im 15/15]
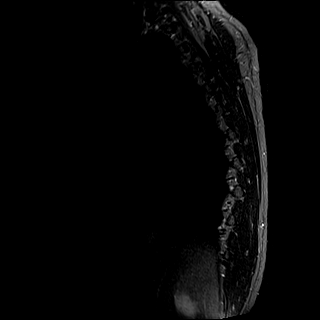

[Series 8: T2 · axial · 5.0mm · 0.86mm/px · z∈[-283,-14]mm · 14 of 39 slices shown (2 of 2)]
[im 1/39]
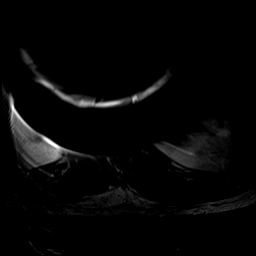
[im 3/39]
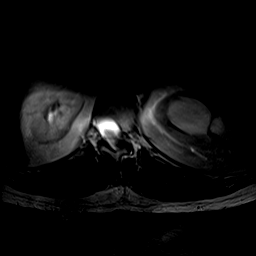
[im 6/39]
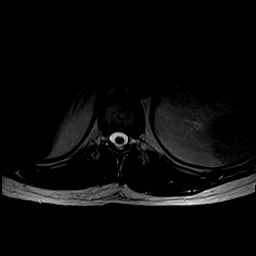
[im 9/39]
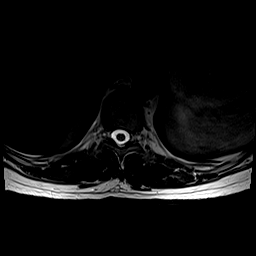
[im 11/39]
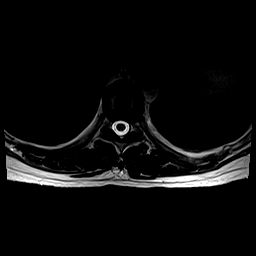
[im 14/39]
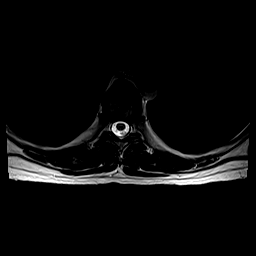
[im 17/39]
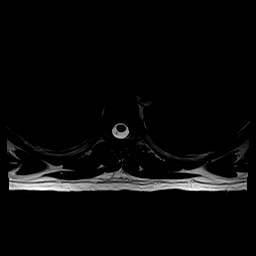
[im 20/39]
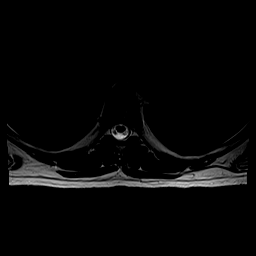
[im 22/39]
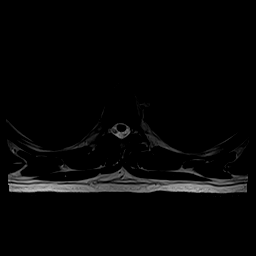
[im 25/39]
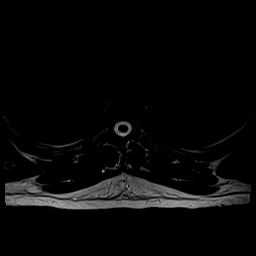
[im 28/39]
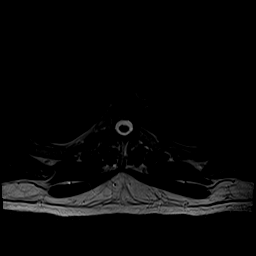
[im 30/39]
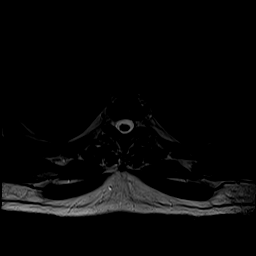
[im 33/39]
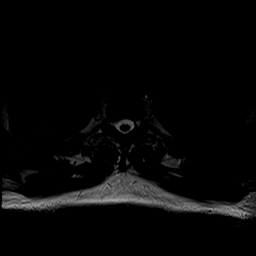
[im 39/39]
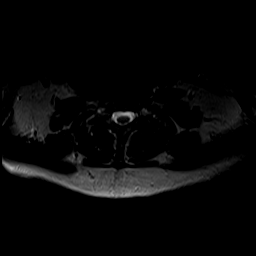

[Series 10: mpgr ax · axial · 5.0mm · 0.35mm/px · z∈[-288,-5]mm · 8 of 39 slices shown]
[im 1/39]
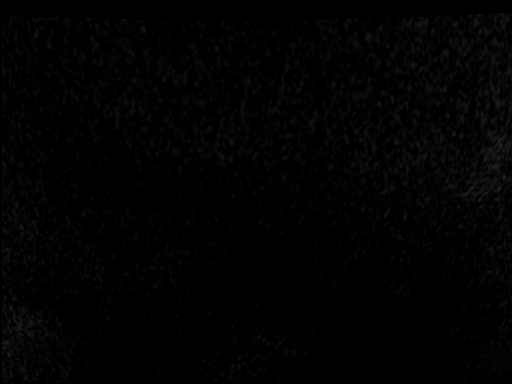
[im 6/39]
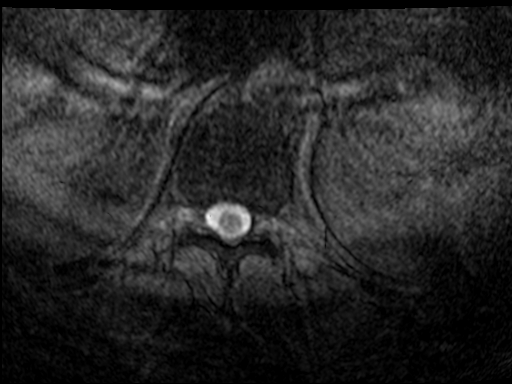
[im 11/39]
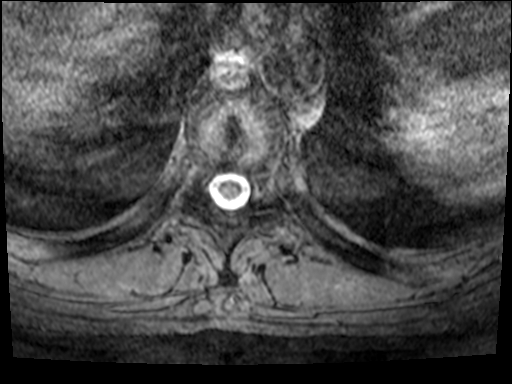
[im 17/39]
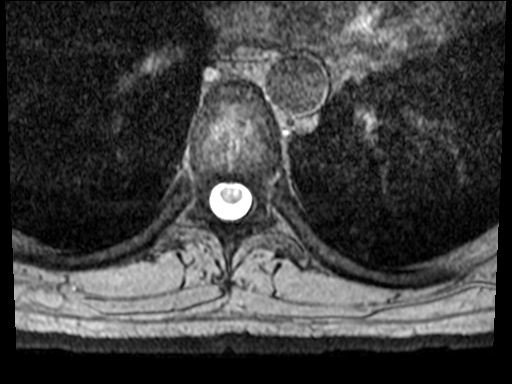
[im 22/39]
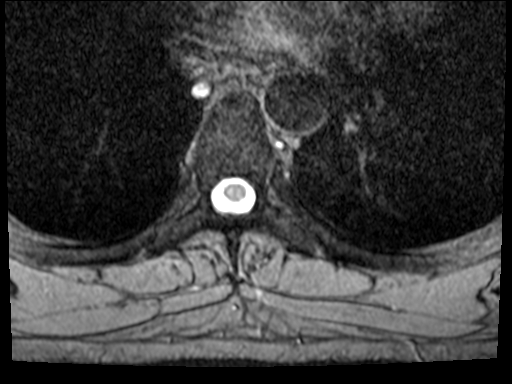
[im 28/39]
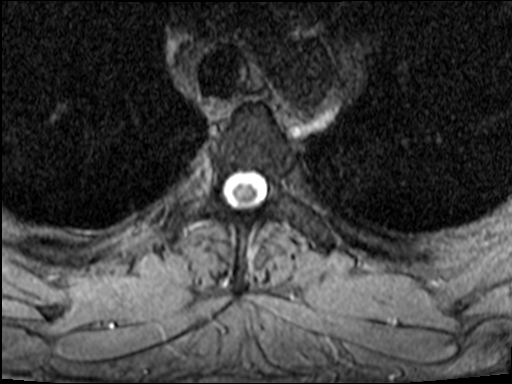
[im 33/39]
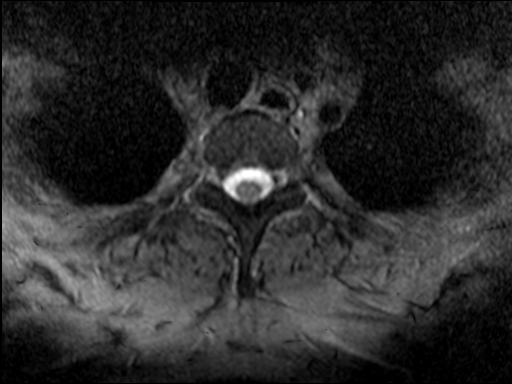
[im 39/39]
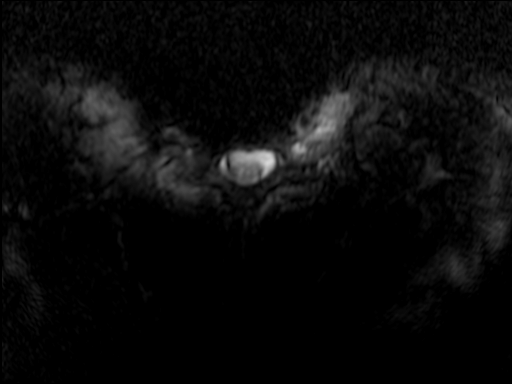

[40 of 48 positions shown; findings below may reference images not displayed]

FINDINGS: Alignment: Vertebral bodies normally aligned with preservation of
the normal thoracic kyphosis. No listhesis or subluxation.

Vertebrae: Vertebral body height maintained without evidence for
acute or chronic fracture. Bone marrow signal intensity within
normal limits. No discrete or worrisome osseous lesions. No abnormal
marrow edema.

Cord:  Signal intensity within the thoracic spinal cord is normal.

Paraspinal and other soft tissues: Paraspinous soft tissues within
normal limits. Partially visualized lungs are clear. Cysts versus
calyceal dilatation partially visualize within the left kidney,
better seen on corresponding MRI of the lumbar spine.

Disc levels:

T1-2:  Unremarkable.

T2-3: Unremarkable.

T3-4:  Unremarkable.

T4-5:  Unremarkable.

T5-6: Normal interspace. Posterior element hypertrophy with mild
flattening of the posterior thecal sac. No spinal stenosis. Foramina
remain patent.

T6-7: Mild disc desiccation with intervertebral disc space
narrowing. No significant disc bulge or focal disc herniation. Mild
posterior element hypertrophy. No canal or foraminal stenosis.

T7-8: Disc desiccation with intervertebral disc space narrowing.
Superimposed tiny central disc protrusion minimally flattens the
ventral thecal sac (series 10, image 23). Superimposed chronic
endplate Schmorl's node deformity at the superior endplate of T8.
Mild posterior element hypertrophy. No canal or foraminal stenosis.

T8-9: Disc desiccation with associated chronic endplate Schmorl's
node deformity. No significant disc bulge or focal disc protrusion.
Mild posterior element hypertrophy. No significant canal or
foraminal stenosis.

T9-10: Mild disc desiccation with associated chronic endplate
Schmorl's node deformities. No significant disc bulge or focal disc
herniation. Mild posterior element hypertrophy. No canal or
foraminal stenosis.

T10-11: Mild disc desiccation with small chronic endplate Schmorl's
node deformities. No significant disc bulge or focal disc
herniation. Mild posterior element hypertrophy. No canal or
foraminal stenosis.

T11-12: Small chronic endplate Schmorl's node deformities without
significant disc bulge or focal disc protrusion. Mild posterior
element hypertrophy. No canal or foraminal stenosis.

T12-L1:  Unremarkable.
IMPRESSION: Mild degenerative disc disease and facet hypertrophy involving the
mid and lower thoracic spine as above. No significant disc bulge or
focal disc herniation. No canal or foraminal stenosis or evidence
for neural impingement.

## 2020-03-07 ENCOUNTER — Encounter: Payer: Self-pay | Admitting: Osteopathic Medicine

## 2020-03-14 ENCOUNTER — Ambulatory Visit (INDEPENDENT_AMBULATORY_CARE_PROVIDER_SITE_OTHER): Payer: No Typology Code available for payment source | Admitting: Osteopathic Medicine

## 2020-03-14 ENCOUNTER — Other Ambulatory Visit: Payer: Self-pay

## 2020-03-14 ENCOUNTER — Encounter: Payer: Self-pay | Admitting: Osteopathic Medicine

## 2020-03-14 VITALS — BP 149/99 | HR 103 | Temp 98.5°F | Wt 175.1 lb

## 2020-03-14 DIAGNOSIS — E559 Vitamin D deficiency, unspecified: Secondary | ICD-10-CM

## 2020-03-14 DIAGNOSIS — E538 Deficiency of other specified B group vitamins: Secondary | ICD-10-CM

## 2020-03-14 DIAGNOSIS — N200 Calculus of kidney: Secondary | ICD-10-CM | POA: Insufficient documentation

## 2020-03-14 DIAGNOSIS — M5134 Other intervertebral disc degeneration, thoracic region: Secondary | ICD-10-CM | POA: Diagnosis not present

## 2020-03-14 DIAGNOSIS — M5030 Other cervical disc degeneration, unspecified cervical region: Secondary | ICD-10-CM | POA: Diagnosis not present

## 2020-03-14 DIAGNOSIS — M5410 Radiculopathy, site unspecified: Secondary | ICD-10-CM

## 2020-03-14 DIAGNOSIS — M5136 Other intervertebral disc degeneration, lumbar region: Secondary | ICD-10-CM

## 2020-03-14 DIAGNOSIS — R9089 Other abnormal findings on diagnostic imaging of central nervous system: Secondary | ICD-10-CM | POA: Insufficient documentation

## 2020-03-14 DIAGNOSIS — R93422 Abnormal radiologic findings on diagnostic imaging of left kidney: Secondary | ICD-10-CM

## 2020-03-14 DIAGNOSIS — G988 Other disorders of nervous system: Secondary | ICD-10-CM

## 2020-03-14 DIAGNOSIS — M51369 Other intervertebral disc degeneration, lumbar region without mention of lumbar back pain or lower extremity pain: Secondary | ICD-10-CM

## 2020-03-14 DIAGNOSIS — M47816 Spondylosis without myelopathy or radiculopathy, lumbar region: Secondary | ICD-10-CM | POA: Insufficient documentation

## 2020-03-14 NOTE — Progress Notes (Signed)
Kirk Williams is a 34 y.o. male who presents to  [facility] at [facility]  today, 03/14/20, seeking care for the following: . Follow up abnormal finding re: kidney (incidental finding on MRI lumbar spine) o MRI 02/2020 showed "Bilateral nephrolithiasis with multiple cysts versus calyceal dilatation within the left kidney. Urologic consultation and referral suggested for further evaluation." . Records reviewed: Urology consult from 11/21/2016 w/ Dr. Laurance Flatten - no symptoms at that time, notes mention passage of small stone about 10 yr prior, gross hematuria 08/2016. See below for CT results. Dx: partial L staghorn stone. Plan as of that visit was RTC 3 mos [place] and KUB, there was discussion of intervention w/ PCNL but patient opted for observation.   CT 08/2016 showed "1. Partial staghorn calculus involving the left renal pelvis and interpolar/lower pole calyces. 2. Cystic lesion in the upper pole sinus of the left kidney, favored to represent a sinus cyst although focal caliectasis is not excluded. 3. No calculi or hydronephrosis in the right kidney.     ASSESSMENT & PLAN with other pertinent history/findings:  The primary encounter diagnosis was Abnormal finding on diagnostic imaging of left kidney. Diagnoses of Neurologic history reviewed , DDD (degenerative disc disease), cervical, DDD (degenerative disc disease), thoracic, DDD (degenerative disc disease), lumbar, Vitamin D deficiency, B12 deficiency, Radiculopathy, unspecified spinal region, and Nephrolithiasis were also pertinent to this visit.  Renal stones - pt has opted for observation for now and I think this is reasonable but would prefer second opinion from urologist - pt would like to see different provider, was not comfortable with previous   Continue to follow with Dr T as directed for orthopedic issues.     There are no Patient Instructions on file for this visit.   Orders  Placed This Encounter  Procedures  . Ambulatory referral to Urology    No orders of the defined types were placed in this encounter.      Follow-up instructions: Return for KEEP CURRENTLY SCHEDULED APPOINTMENT WITH DR T, WILL SEE DR A BACK AS NEEDED / WHEN DUE FOR ANNUAL .                                         BP (!) 149/99 (BP Location: Left Arm, Patient Position: Sitting, Cuff Size: Normal)   Pulse (!) 103   Temp 98.5 F (36.9 C) (Oral)   Wt 175 lb 1.9 oz (79.4 kg)   BMI 25.13 kg/m   Current Meds  Medication Sig  . traMADol (ULTRAM) 50 MG tablet     No results found for this or any previous visit (from the past 72 hour(s)).  No results found.  Depression screen [facility] 2/9 02/25/2020  Decreased Interest 0  Down, Depressed, Hopeless 0  PHQ - 2 Score 0  Altered sleeping 0  Tired, decreased energy 1  Change in appetite 0  Feeling bad or failure about yourself  0  Trouble concentrating 0  Moving slowly or fidgety/restless 0  Suicidal thoughts 0  PHQ-9 Score 1  Difficult doing work/chores Somewhat difficult    GAD 7 : Generalized Anxiety Score 02/25/2020  Nervous, Anxious, on Edge 0  Control/stop worrying 0  Worry too much - different things 0  Trouble relaxing 0  Restless 0  Easily annoyed or irritable 0  Afraid - awful might happen 0  Total GAD 7 Score 0      All questions at time of visit were answered - patient instructed to contact office with any additional concerns or updates.  ER/RTC precautions were reviewed with the patient.  Please note: voice recognition software was used to produce this document, and typos may escape review. Please contact Dr. Sheppard Coil for any needed clarifications.

## 2020-03-30 ENCOUNTER — Other Ambulatory Visit: Payer: Self-pay

## 2020-03-30 ENCOUNTER — Ambulatory Visit (INDEPENDENT_AMBULATORY_CARE_PROVIDER_SITE_OTHER): Payer: No Typology Code available for payment source | Admitting: Sports Medicine

## 2020-03-30 DIAGNOSIS — M5030 Other cervical disc degeneration, unspecified cervical region: Secondary | ICD-10-CM

## 2020-03-30 DIAGNOSIS — M47816 Spondylosis without myelopathy or radiculopathy, lumbar region: Secondary | ICD-10-CM

## 2020-03-30 NOTE — Assessment & Plan Note (Addendum)
This is a very pleasant 34 year old male, he has chronic axial low back pain, MRI did show bilateral L4-S1 facet joint arthritis, today we are going to do Depo-Medrol injections into the trigger points bilaterally. If this does not provide good long-lasting relief we will set him up for bilateral facet joint injections with plans to proceed with medial branch blocks and radiofrequency ablation in the future. He does prefer Depo-Medrol in the lumbar spine and Kenalog in the cervical spine for trigger points.

## 2020-03-30 NOTE — Assessment & Plan Note (Signed)
Kirk Williams returns, he has done extremely well with Kenalog trigger point injections in his paracervical muscles at the last visit, no change in plan for now.

## 2020-03-30 NOTE — Progress Notes (Signed)
    Procedures performed today:    Procedure:  Injection of bilateral paralumbar trigger points Consent obtained and verified. Time-out conducted. Noted no overlying erythema, induration, or other signs of local infection. Skin prepped in a sterile fashion. Topical analgesic spray: Ethyl chloride. Completed without difficulty. Meds: A total of 2 mL Depo-Medrol 40 and 10 cc lidocaine spread out between the 2 paralumbar trigger points  Pain immediately improved suggesting accurate placement of the medication. Advised to call if fevers/chills, erythema, induration, drainage, or persistent bleeding.  Independent interpretation of notes and tests performed by another provider:   None.  Brief History, Exam, Impression, and Recommendations:    Facet arthropathy, lumbar This is a very pleasant 34 year old male, he has chronic axial low back pain, MRI did show bilateral L4-S1 facet joint arthritis, today we are going to do Depo-Medrol injections into the trigger points bilaterally. If this does not provide good long-lasting relief we will set him up for bilateral facet joint injections with plans to proceed with medial branch blocks and radiofrequency ablation in the future. He does prefer Depo-Medrol in the lumbar spine and Kenalog in the cervical spine for trigger points.  DDD (degenerative disc disease), cervical Azell returns, he has done extremely well with Kenalog trigger point injections in his paracervical muscles at the last visit, no change in plan for now.    ___________________________________________ Ihor Austin. Benjamin Stain, M.D., ABFM., CAQSM. Primary Care and Sports Medicine [facility]  Adjunct Instructor of Family Medicine  [employer]

## 2020-05-10 ENCOUNTER — Encounter (INDEPENDENT_AMBULATORY_CARE_PROVIDER_SITE_OTHER): Payer: No Typology Code available for payment source

## 2020-05-10 DIAGNOSIS — M47816 Spondylosis without myelopathy or radiculopathy, lumbar region: Secondary | ICD-10-CM | POA: Diagnosis not present

## 2020-05-10 DIAGNOSIS — M5134 Other intervertebral disc degeneration, thoracic region: Secondary | ICD-10-CM | POA: Diagnosis not present

## 2020-05-10 DIAGNOSIS — M5030 Other cervical disc degeneration, unspecified cervical region: Secondary | ICD-10-CM

## 2020-05-11 ENCOUNTER — Ambulatory Visit: Payer: No Typology Code available for payment source | Admitting: Osteopathic Medicine

## 2020-05-19 ENCOUNTER — Ambulatory Visit: Payer: No Typology Code available for payment source | Admitting: Osteopathic Medicine

## 2020-05-26 ENCOUNTER — Ambulatory Visit: Payer: No Typology Code available for payment source | Admitting: Osteopathic Medicine

## 2020-05-30 ENCOUNTER — Ambulatory Visit (INDEPENDENT_AMBULATORY_CARE_PROVIDER_SITE_OTHER): Payer: No Typology Code available for payment source | Admitting: Sports Medicine

## 2020-05-30 ENCOUNTER — Other Ambulatory Visit: Payer: Self-pay

## 2020-05-30 DIAGNOSIS — M5134 Other intervertebral disc degeneration, thoracic region: Secondary | ICD-10-CM

## 2020-05-30 DIAGNOSIS — M5030 Other cervical disc degeneration, unspecified cervical region: Secondary | ICD-10-CM

## 2020-05-30 NOTE — Progress Notes (Signed)
    Procedures performed today:    Procedure:  Injection of paracervical and trapezial trigger points Consent obtained and verified. Time-out conducted. Noted no overlying erythema, induration, or other signs of local infection. Skin prepped in a sterile fashion. Topical analgesic spray: Ethyl chloride. Completed without difficulty. Meds: A total of 1 cc Kenalog 40 and 4 cc lidocaine spread out between the 4 trigger points. Pain immediately improved suggesting accurate placement of the medication. Advised to call if fevers/chills, erythema, induration, drainage, or persistent bleeding.  Procedure:  Injection of parathoracic muscle trigger point Consent obtained and verified. Time-out conducted. Noted no overlying erythema, induration, or other signs of local infection. Skin prepped in a sterile fashion. Topical analgesic spray: Ethyl chloride. Completed without difficulty. Meds: A total of 1 cc Depo-Medrol 40 and 4 cc lidocaine spread out between the 2 parathoracic trigger points. Pain immediately improved suggesting accurate placement of the medication. Advised to call if fevers/chills, erythema, induration, drainage, or persistent bleeding.  Independent interpretation of notes and tests performed by another provider:   None.  Brief History, Exam, Impression, and Recommendations:    DDD (degenerative disc disease), cervical Keymani does extremely well with Kenalog trigger point injections, this was last done in May of this year. Repeat bilateral paracervical trigger point injections with Kenalog 40.  DDD (degenerative disc disease), thoracic Arlys John also continues to do well with bilateral parathoracic trigger point injections, he does prefer Depo-Medrol in the parathoracic musculature. This was performed today as well, last done in May of this year. Return as needed.    ___________________________________________ Ihor Austin. Benjamin Stain, M.D., ABFM., CAQSM. Primary Care and  Sports Medicine [facility]  Adjunct Instructor of Family Medicine  [employer]

## 2020-05-30 NOTE — Assessment & Plan Note (Signed)
Kirk Williams also continues to do well with bilateral parathoracic trigger point injections, he does prefer Depo-Medrol in the parathoracic musculature. This was performed today as well, last done in May of this year. Return as needed.

## 2020-05-30 NOTE — Assessment & Plan Note (Signed)
Coby does extremely well with Kenalog trigger point injections, this was last done in May of this year. Repeat bilateral paracervical trigger point injections with Kenalog 40.

## 2020-06-15 ENCOUNTER — Ambulatory Visit (INDEPENDENT_AMBULATORY_CARE_PROVIDER_SITE_OTHER): Payer: No Typology Code available for payment source | Admitting: Osteopathic Medicine

## 2020-06-15 VITALS — BP 145/99 | HR 106 | Temp 98.2°F | Wt 180.0 lb

## 2020-06-15 DIAGNOSIS — G901 Familial dysautonomia [Riley-Day]: Secondary | ICD-10-CM | POA: Diagnosis not present

## 2020-06-15 DIAGNOSIS — M62838 Other muscle spasm: Secondary | ICD-10-CM | POA: Diagnosis not present

## 2020-06-15 MED ORDER — GABAPENTIN 100 MG PO CAPS: 100.0000 mg | ORAL_CAPSULE | Freq: Three times a day (TID) | ORAL | 3 refills | Status: DC | PRN

## 2020-06-15 NOTE — Progress Notes (Signed)
HPI: Kirk Williams is a 34 y.o. male who  has a past medical history of Carpal tunnel syndrome, Cervical radiculopathy, Lumbar radiculopathy, and Mid back pain.  he presents to [facility] today, 06/15/20,  for chief complaint of: Neurological issues  Patient reports he has been having flares of his neurological issues more frequently over the last few months. He states initially they occurred a couple times per month, but are now occurring twice a week. He states these episodes include temperature fluctuations, headaches, nasal drainage, muscle aches/cramps/twitching in forearms, lower legs, and burning/tingling pain. No focal weakness, no vision changes. He states he has been under increased stress for a few months since his grandparents died and he has been managing their estate.   He states he has not followed with neurology since the issues with his records in 2019, but states they had recommended he have another MRI in 2020, which did not happen. He currently follows with Dr. Karie Schwalbe for spinal injections, and states these do lower the pain significantly. He recently saw a urologist who is monitoring his kidney stones, who he is very happy with.  Past medical, surgical, social and family history reviewed:  Patient Active Problem List   Diagnosis Date Noted  . Nephrolithiasis 03/14/2020  . Abnormal finding on diagnostic imaging of left kidney 03/14/2020  . Facet arthropathy, lumbar 03/14/2020  . Abnormal finding on MRI of brain 03/14/2020  . Neurologic history reviewed  03/14/2020  . DDD (degenerative disc disease), cervical 03/02/2020  . DDD (degenerative disc disease), thoracic 03/02/2020    Past Surgical History:  Procedure Laterality Date  . TONSILLECTOMY      Social History   Tobacco Use  . Smoking status: Current Some Day Smoker    Types: Cigarettes  . Smokeless tobacco: Never Used  . Tobacco comment: 5-6 cigs a day  Substance Use Topics  .  Alcohol use: Never    Family History  Problem Relation Age of Onset  . Scoliosis Mother   . Fibromyalgia Mother   . High blood pressure Father   . High Cholesterol Father        Nelia Shi Syndrome     Current medication list and allergy/intolerance information reviewed:    Current Outpatient Medications  Medication Sig Dispense Refill  . traMADol (ULTRAM) 50 MG tablet     . gabapentin (NEURONTIN) 100 MG capsule Take 1-3 capsules (100-300 mg total) by mouth 3 (three) times daily as needed (muscle twitching/spasm). 90 capsule 3   No current facility-administered medications for this visit.    Allergies  Allergen Reactions  . Amoxicillin Diarrhea and Nausea Only    Able to do liquid formula. Tabs make nauseated.  Doesn't take the tablets       Review of Systems:  Constitutional:  +temperature fluctuations. No  fever, no chills  HEENT: +  headache, no vision change, + nasal drainage  Cardiac: No  chest pain  Respiratory:  No  shortness of breath. No  Cough  Gastrointestinal: No  abdominal pain  Musculoskeletal: + muscle twitch/cramps  Skin: No  Rash, No other wounds/concerning lesions   Neurologic: No  weakness, No  dizziness,  Psychiatric: No  concerns with depression  Exam:  BP (!) 145/99 (BP Location: Left Arm, Patient Position: Sitting, Cuff Size: Normal)   Pulse (!) 106   Temp 98.2 F (36.8 C) (Oral)   Wt 180 lb (81.6 kg)   BMI 25.83 kg/m   Constitutional: VS see above.  General Appearance: alert, well-developed, well-nourished, NAD  Eyes: Normal lids and conjunctive, non-icteric sclera   Neck: No masses, trachea midline.   Respiratory: Normal respiratory effort.  Cardiovascular: S1/S2 normal, no murmur, no rub/gallop auscultated. RRR.  Gastrointestinal: Nontender, no masses.  Musculoskeletal: Gait normal. No clubbing/cyanosis of digits.   Neurological: Normal balance/coordination. No tremor. No cranial nerve deficit on limited  exam.  Skin: warm, dry, intact. No rash/ulcer. No concerning nevi or subq nodules on limited exam.  Psychiatric: Normal judgment/insight. Normal mood and affect. Oriented x3.    No results found for this or any previous visit (from the past 72 hour(s)).  No results found.   ASSESSMENT/PLAN: The primary encounter diagnosis was Muscle spasm. A diagnosis of Dysautonomia (HCC) was also pertinent to this visit.   Muscle spasm  Pt's presentation is not truly consistent with MS (intermittent in same areas, rather than sustained deficits; no vision changes or cerebellar symptoms, previous MRI stable T2 lesions over 2+ years)), but may be neurological. Pt also has (+)FH of fibromyalgia. Symptoms may be fibro variant.   Currently takes tramadol PRN for headache/pain symptoms and would like to avoid daily medications if possible. Prescribed gabapentin 100-300mg  to take as needed for muscle twitching/burning pain.  If symptoms do not improve with gabapentin, will consider MRI brain +/- MRI C spine to re-evaluate. Could also consider Lyrica/SSRI/SNRI as a daily preventive. Pt may need to see a new neurologist if we get new MRIs.  No orders of the defined types were placed in this encounter.   Meds ordered this encounter  Medications  . gabapentin (NEURONTIN) 100 MG capsule    Sig: Take 1-3 capsules (100-300 mg total) by mouth 3 (three) times daily as needed (muscle twitching/spasm).    Dispense:  90 capsule    Refill:  3    Patient Instructions  Plan: --> trial gabapentin as needed, 1 - 3 capsules at a time, up to 3 times per day, as needed for spasm/twitching.  --> if not better, will get MRI brain and c-spine, but I doubt anything going on  In brain, spinal cord. I think might be more muscular? Will see!  --> consider daily preventive treatment with Lyrica or something similar to Zoloft           Visit summary with medication list and pertinent instructions was printed for  patient to review. All questions at time of visit were answered - patient instructed to contact office with any additional concerns or updates. ER/RTC precautions were reviewed with the patient.   Note: Total time spent 30 minutes, greater than 50% of the visit was spent face-to-face counseling and coordinating care for the above diagnoses listed in assessment/plan.   Please note: voice recognition software was used to produce this document, and typos may escape review. Please contact Dr. Lyn Hollingshead for any needed clarifications.     Follow-up plan: Return for KEEP CURRENTLY SCHEDULED APPOINTMENT - SEEING DR T, CAN EVALUATE NEED FOR MRI (SEE ME SAME DAY OK).  Rutha Bouchard, [facility] MS3

## 2020-06-15 NOTE — Patient Instructions (Addendum)
Plan: --> trial gabapentin as needed, 1 - 3 capsules at a time, up to 3 times per day, as needed for spasm/twitching.  --> if not better, will get MRI brain and c-spine, but I doubt anything going on  In brain, spinal cord. I think might be more muscular? Will see!  --> consider daily preventive treatment with Lyrica or something similar to Zoloft

## 2020-06-27 ENCOUNTER — Ambulatory Visit: Payer: No Typology Code available for payment source | Admitting: Osteopathic Medicine

## 2020-06-29 ENCOUNTER — Other Ambulatory Visit: Payer: Self-pay

## 2020-06-29 ENCOUNTER — Encounter: Payer: Self-pay | Admitting: Sports Medicine

## 2020-06-29 ENCOUNTER — Ambulatory Visit (INDEPENDENT_AMBULATORY_CARE_PROVIDER_SITE_OTHER): Payer: No Typology Code available for payment source | Admitting: Sports Medicine

## 2020-06-29 ENCOUNTER — Telehealth: Payer: Self-pay | Admitting: Osteopathic Medicine

## 2020-06-29 DIAGNOSIS — M47816 Spondylosis without myelopathy or radiculopathy, lumbar region: Secondary | ICD-10-CM | POA: Diagnosis not present

## 2020-06-29 NOTE — Telephone Encounter (Signed)
PT would like to switch his care to Dr. Ashley Royalty. No reason given.

## 2020-06-29 NOTE — Assessment & Plan Note (Signed)
Kirk Williams is a pleasant 34 year old male, chronic axial low back pain, MRI in the past has shown L4-S1 facet joint arthritis bilaterally, last Depo-Medrol trigger point injection was back in June, we have repeated this today. Return to see me as needed, again he prefers Depo-Medrol in the lumbar spine and Kenalog in the cervical spine for trigger points.

## 2020-06-29 NOTE — Progress Notes (Signed)
    Procedures performed today:    Procedure:  Injection of bilateral paralumbar trigger points Consent obtained and verified. Time-out conducted. Noted no overlying erythema, induration, or other signs of local infection. Skin prepped in a sterile fashion. Topical analgesic spray: Ethyl chloride. Completed without difficulty. Meds: A total of 2 mL Depo-Medrol 40 and 10 cc lidocaine spread out between the 2 paralumbar trigger points Pain immediately improved suggesting accurate placement of the medication. Advised to call if fevers/chills, erythema, induration, drainage, or persistent bleeding.  Independent interpretation of notes and tests performed by another provider:   None.  Brief History, Exam, Impression, and Recommendations:    Facet arthropathy, lumbar Kirk Williams is a pleasant 34 year old male, chronic axial low back pain, MRI in the past has shown L4-S1 facet joint arthritis bilaterally, last Depo-Medrol trigger point injection was back in June, we have repeated this today. Return to see me as needed, again he prefers Depo-Medrol in the lumbar spine and Kenalog in the cervical spine for trigger points.    ___________________________________________ Ihor Austin. Benjamin Stain, M.D., ABFM., CAQSM. Primary Care and Sports Medicine [facility]  Adjunct Instructor of Family Medicine  [employer]

## 2020-06-29 NOTE — Telephone Encounter (Signed)
Dr Ashley Royalty and I are not going to switch this patient

## 2020-07-05 NOTE — Telephone Encounter (Signed)
Left recommendations on voicemail.

## 2020-07-21 ENCOUNTER — Telehealth: Payer: Self-pay | Admitting: Osteopathic Medicine

## 2020-07-21 NOTE — Telephone Encounter (Signed)
Edit: note by patient written for Dr Lyn Hollingshead has been left with Dr. Ashley Royalty since Dr Lyn Hollingshead is out of the office for a while and Dr Ashley Royalty is the covering provider.

## 2020-07-21 NOTE — Telephone Encounter (Signed)
Will defer for Dr. Lyn Hollingshead to address letter once she returns as this is non-urgent.   I will not accept transfer of patient to me.  Records reviewed and I wouldn't have anything additional or different to offer due to similar practice style to Dr. Lyn Hollingshead.   If he feels that Dr. Lyn Hollingshead is not a good fit he may want to pursue care with a different practice.

## 2020-07-21 NOTE — Telephone Encounter (Signed)
Patient came in the office and brought a couple letters in, requesting a "written explanation" of his denied transfer and stated that he has spoke with Haskell Riling about this. Notes have been left in [facility] box and Dr Mardelle Matte box.

## 2020-07-26 NOTE — Telephone Encounter (Signed)
Agree with Dr Ashley Royalty. I trust Toniann Fail to explain the practice's position on patient transfers, and as the patient has not offered any acceptable reason for transfer or made any attempts with me to address his concerns directly, I will not be offering additional information to this patient.

## 2020-08-30 ENCOUNTER — Ambulatory Visit (INDEPENDENT_AMBULATORY_CARE_PROVIDER_SITE_OTHER): Payer: No Typology Code available for payment source | Admitting: Sports Medicine

## 2020-08-30 DIAGNOSIS — M5030 Other cervical disc degeneration, unspecified cervical region: Secondary | ICD-10-CM

## 2020-08-30 DIAGNOSIS — M47816 Spondylosis without myelopathy or radiculopathy, lumbar region: Secondary | ICD-10-CM | POA: Diagnosis not present

## 2020-08-30 MED ORDER — PREGABALIN 50 MG PO CAPS: 50.0000 mg | ORAL_CAPSULE | Freq: Two times a day (BID) | ORAL | 3 refills | Status: DC

## 2020-08-30 NOTE — Progress Notes (Signed)
    Procedures performed today:    Procedure:  Injection of #4 paracervical trigger points Consent obtained and verified. Time-out conducted. Noted no overlying erythema, induration, or other signs of local infection. Skin prepped in a sterile fashion. Topical analgesic spray: Ethyl chloride. Completed without difficulty. Meds: A total of 1 cc kenalog 40, 4 cc lidocaine sprayed out between #4 paracervical and trapezial trigger points. Advised to call if fevers/chills, erythema, induration, drainage, or persistent bleeding.  Independent interpretation of notes and tests performed by another provider:   None.  Brief History, Exam, Impression, and Recommendations:    DDD (degenerative disc disease), cervical Kirk Williams continues to do well with occasional Kenalog trigger point injections in the cervical spine. These were last done in August, today we repeated bilateral paracervical trigger point injections. He did have an x-ray that showed C5-C6 DDD, at this point due to failure of greater than 6 weeks of conservative treatment we are going to proceed with a cervical spine MRI for epidural planning, he may get better relief with an epidural in the future. He did not have a good response to gabapentin, he also has declined any form of antidepressant treatment though we did discuss that the mechanism may be somewhat beneficial for his neuropathic type pain. We will keep this on the back burner for a future date. In the meantime we will start Lyrica 50 mg twice daily with the understanding that we will taper this up over the next 6 months as needed. He does understand that we will not be using any opiate or opiate related controlled substances.  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 MRI has shown L4-S1 facet joint arthritis, this can certainly be a target for injection or RFA in the future.    ___________________________________________ Kirk Williams. Kirk Williams, M.D., ABFM., CAQSM. Primary Care and Sports Medicine [facility]  Adjunct Instructor of Family Medicine  [employer]

## 2020-08-30 NOTE — Assessment & Plan Note (Signed)
Ludwig continues to do well with occasional Kenalog trigger point injections in the cervical spine. These were last done in August, today we repeated bilateral paracervical trigger point injections. He did have an x-ray that showed C5-C6 DDD, at this point due to failure of greater than 6 weeks of conservative treatment we are going to proceed with a cervical spine MRI for epidural planning, he may get better relief with an epidural in the future. He did not have a good response to gabapentin, he also has declined any form of antidepressant treatment though we did discuss that the mechanism may be somewhat beneficial for his neuropathic type pain. We will keep this on the back burner for a future date. In the meantime we will start Lyrica 50 mg twice daily with the understanding that we will taper this up over the next 6 months as needed. He does understand that we will not be using any opiate or opiate related controlled substances.

## 2020-08-30 NOTE — Assessment & Plan Note (Signed)
Kirk Williams typically gets Depo-Medrol trigger point injections in the thoracolumbar spine, these were last done in September 2021, it is a little bit too early to do them again so we can defer this to the future. MRI has shown L4-S1 facet joint arthritis, this can certainly be a target for injection or RFA in the future.

## 2020-09-11 ENCOUNTER — Other Ambulatory Visit: Payer: Self-pay

## 2020-09-11 ENCOUNTER — Ambulatory Visit (INDEPENDENT_AMBULATORY_CARE_PROVIDER_SITE_OTHER): Payer: No Typology Code available for payment source

## 2020-09-11 DIAGNOSIS — M4802 Spinal stenosis, cervical region: Secondary | ICD-10-CM

## 2020-09-11 IMAGING — MR MR CERVICAL SPINE W/O CM
5 series · 41 of 48 positions shown · non-contrast
Comparison: None.

CLINICAL DATA: Spinal stenosis.

EXAM:
MRI CERVICAL SPINE WITHOUT CONTRAST
TECHNIQUE: Multiplanar, multisequence MR imaging of the cervical spine was
performed. No intravenous contrast was administered.

[Series 4: T2 · sagittal · 3.0mm · 0.69mm/px · 6 of 13 slices shown (1 of 2)]
[im 1/13]
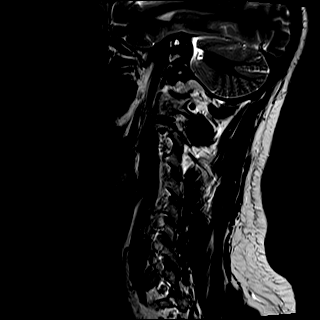
[im 3/13]
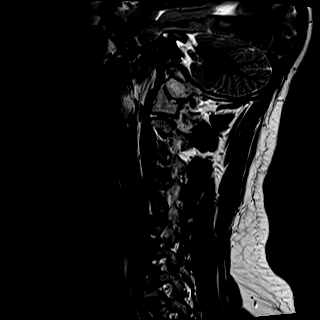
[im 5/13]
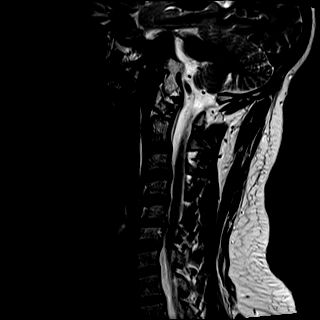
[im 8/13]
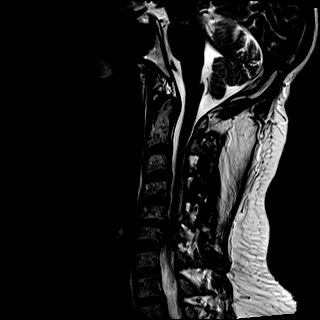
[im 10/13]
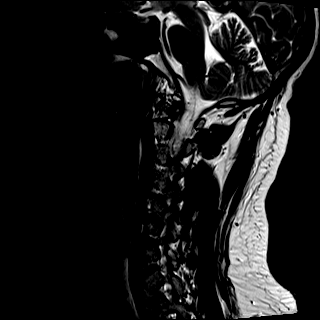
[im 13/13]
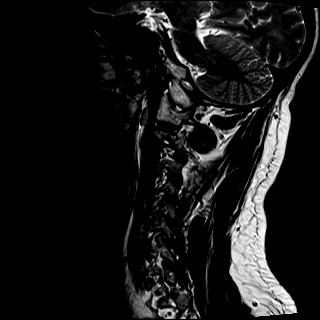

[Series 5: T1 · sagittal · 3.0mm · 0.86mm/px · 7 of 13 slices shown]
[im 1/13]
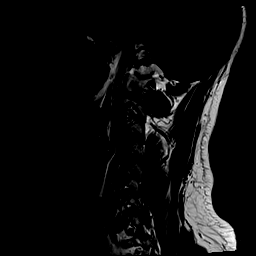
[im 3/13]
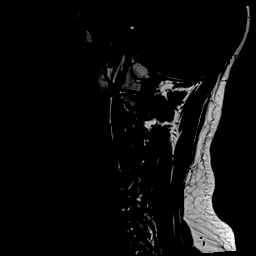
[im 5/13]
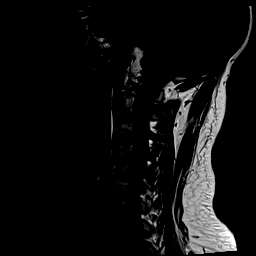
[im 7/13]
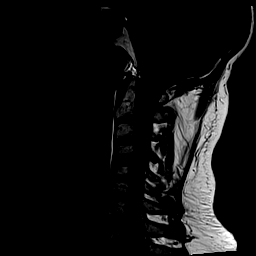
[im 9/13]
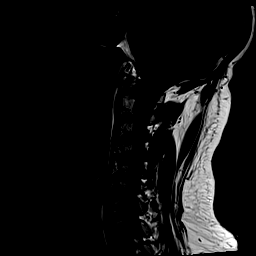
[im 11/13]
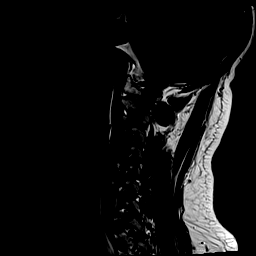
[im 13/13]
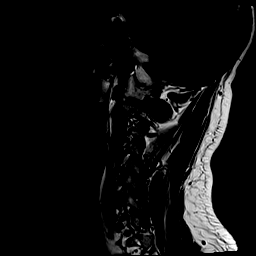

[Series 6: STIR · sagittal · 3.0mm · 0.69mm/px · 7 of 13 slices shown]
[im 1/13]
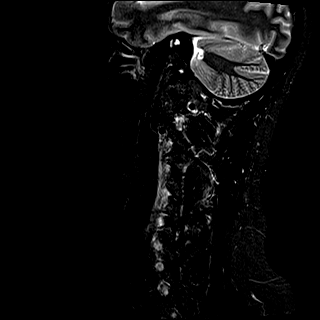
[im 3/13]
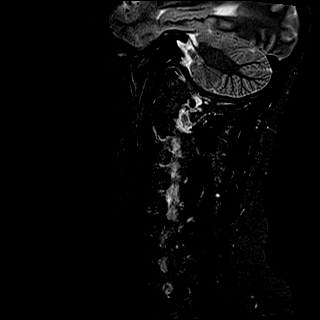
[im 5/13]
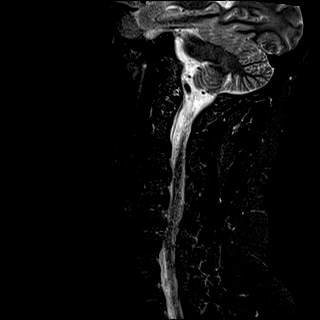
[im 7/13]
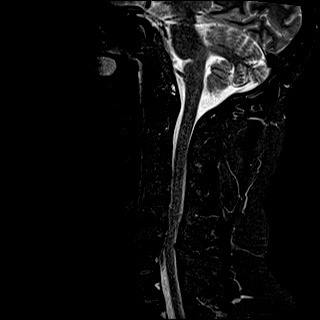
[im 9/13]
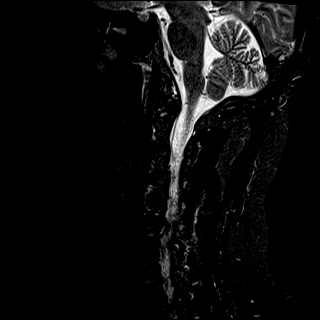
[im 11/13]
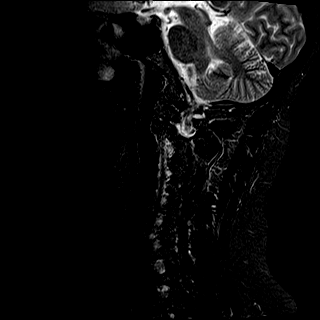
[im 13/13]
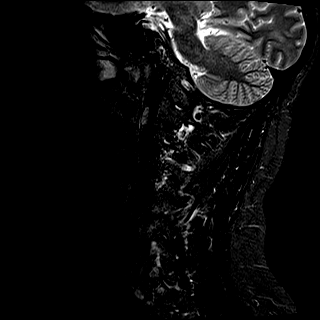

[Series 7: T2 · axial · 3.0mm · 0.62mm/px · z∈[-75,+27]mm · 13 of 28 slices shown (2 of 2)]
[im 1/28]
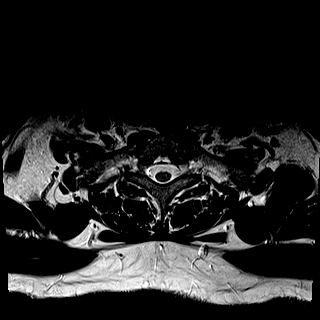
[im 3/28]
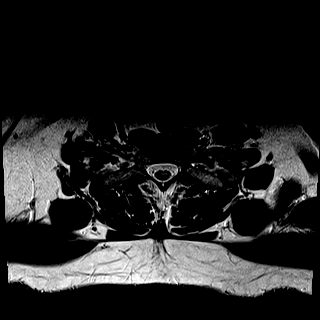
[im 5/28]
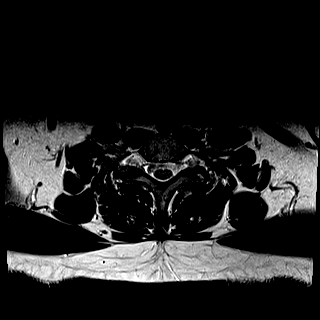
[im 7/28]
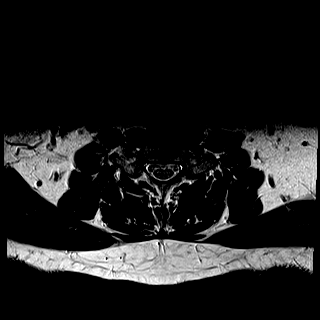
[im 9/28]
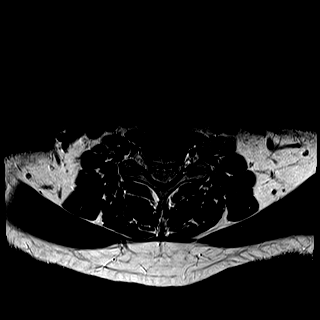
[im 11/28]
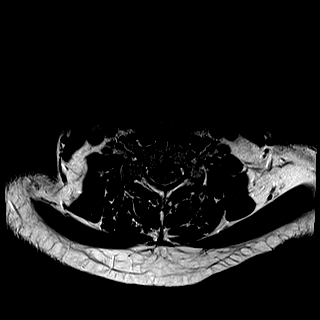
[im 13/28]
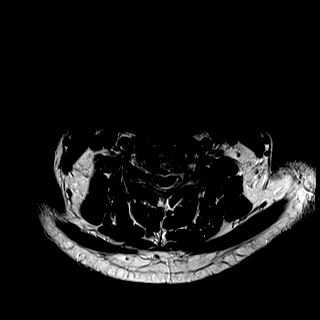
[im 15/28]
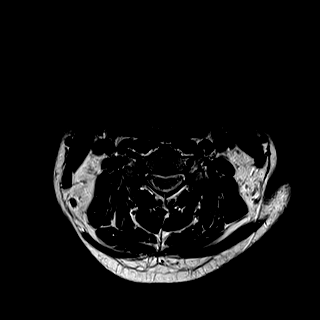
[im 17/28]
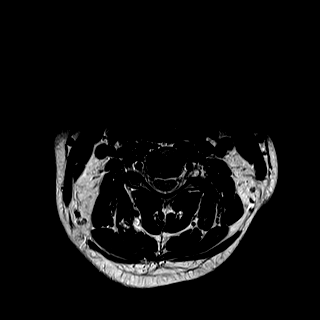
[im 19/28]
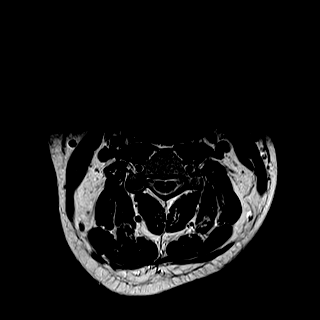
[im 21/28]
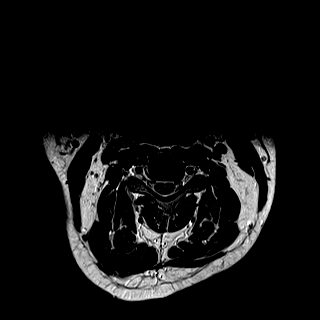
[im 23/28]
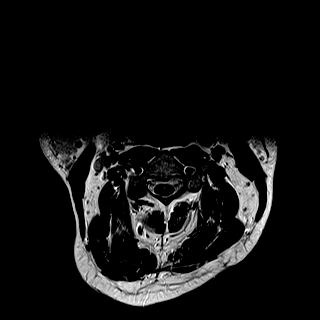
[im 28/28]
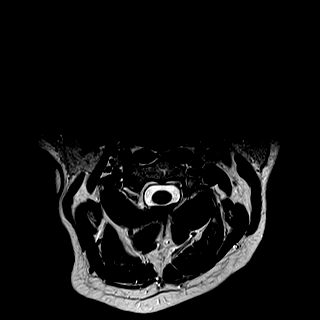

[Series 8: mpgr ax · axial · 3.0mm · 0.35mm/px · z∈[-68,+34]mm · 8 of 28 slices shown]
[im 1/28]
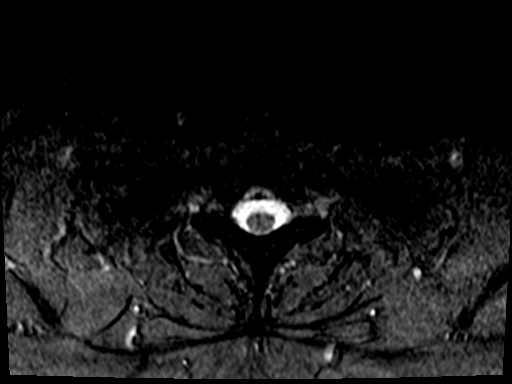
[im 5/28]
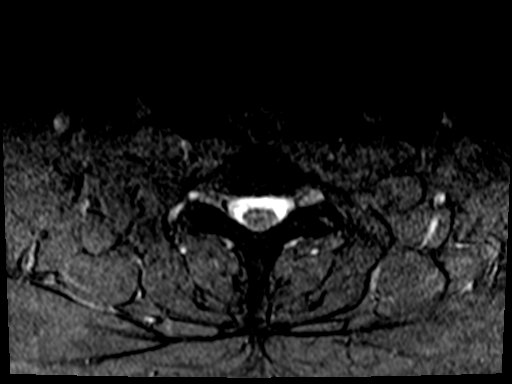
[im 9/28]
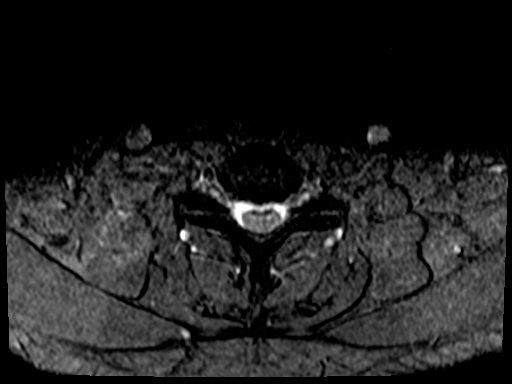
[im 13/28]
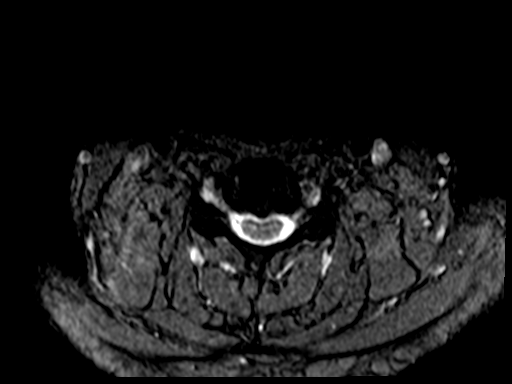
[im 15/28]
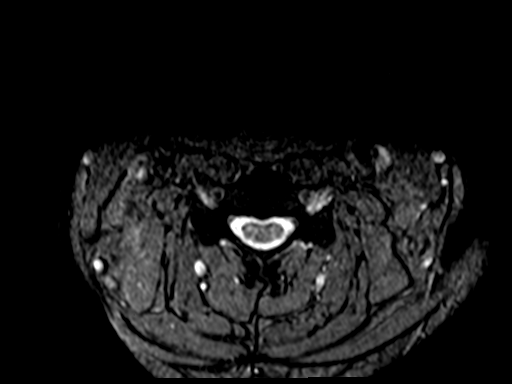
[im 19/28]
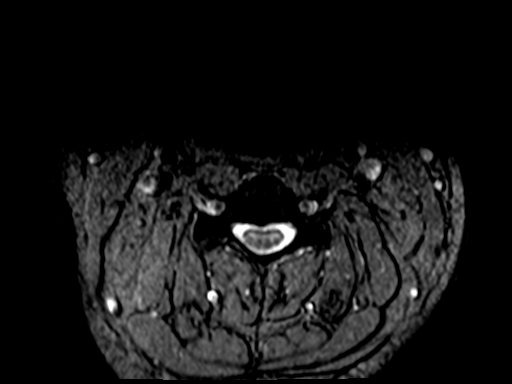
[im 23/28]
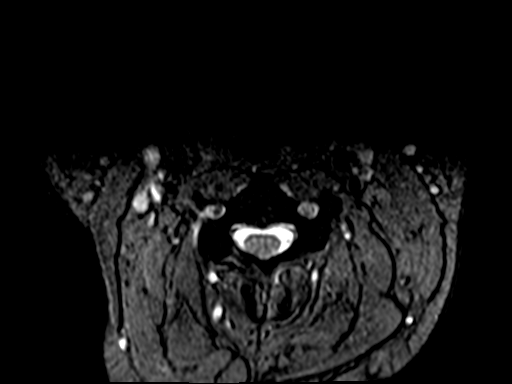
[im 28/28]
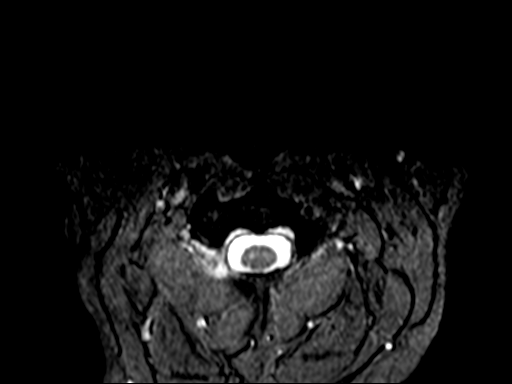

[41 of 48 positions shown; findings below may reference images not displayed]

FINDINGS: Alignment: Straightening of the normal cervical lordosis is noted.
Degenerative retrolisthesis at C5-6 measures 3 mm. No other
significant listhesis is present.

Vertebrae: Mild endplate marrow changes are present at C5-6. Marrow
signal and vertebral body heights are otherwise normal.

Cord: Normal signal and morphology.

Posterior Fossa, vertebral arteries, paraspinal tissues:
Craniocervical junction is normal. Flow is present in the vertebral
arteries bilaterally. Visualized intracranial contents are normal.

Disc levels:

C2-3: Negative.

C3-4: Mild uncovertebral spurring is present on the left. No
significant stenosis is present.

C4-5: Mild uncovertebral spurring is worse on the right. No
significant stenosis is present.

C5-6: A broad-based disc osteophyte complex is present. Central
canal narrowed to 7 mm. Moderate foraminal narrowing is present
bilaterally.

C6-7: Uncovertebral spurring is worse on the left without
significant stenosis.

C7-T1: Negative.
IMPRESSION: 1. Moderate central and bilateral foraminal stenosis at C5-6
secondary to a broad-based disc osteophyte complex and uncovertebral
spurring. The central canal is narrowed to 7 mm. No definite cord
signal abnormality is present.
2. Uncovertebral spurring at C3-4, C4-5, and C6-7 without
significant stenosis at these levels.

## 2020-09-14 ENCOUNTER — Telehealth (INDEPENDENT_AMBULATORY_CARE_PROVIDER_SITE_OTHER): Payer: No Typology Code available for payment source | Admitting: Sports Medicine

## 2020-09-14 DIAGNOSIS — M5030 Other cervical disc degeneration, unspecified cervical region: Secondary | ICD-10-CM | POA: Diagnosis not present

## 2020-09-14 NOTE — Progress Notes (Signed)
   Virtual Visit via Telephone   I connected with  Betzalel Umbarger  on 09/14/20 by telephone/telehealth and verified that I am speaking with the correct person using two identifiers.   I discussed the limitations, risks, security and privacy concerns of performing an evaluation and management service by telephone, including the higher likelihood of inaccurate diagnosis and treatment, and the availability of in person appointments.  We also discussed the likely need of an additional face to face encounter for complete and high quality delivery of care.  I also discussed with the patient that there may be a patient responsible charge related to this service. The patient expressed understanding and wishes to proceed.  Provider location is in medical facility. Patient location is at their home, different from provider location. People involved in care of the patient during this telehealth encounter were myself, my nurse/medical assistant, and my front office/scheduling team member.  Review of Systems: No fevers, chills, night sweats, weight loss, chest pain, or shortness of breath.   Objective Findings:    General: Speaking full sentences, no audible heavy breathing.  Sounds alert and appropriately interactive.    Independent interpretation of tests performed by another provider:   None.  Brief History, Exam, Impression, and Recommendations:    DDD (degenerative disc disease), cervical Asuncion returns in a virtual visit, he is a 34 year old male, he has known chronic neck pain, historically we have done bilateral paracervical trigger point injections, ultimately an x-ray showed C5-C6 DDD, we obtained an MRI that confirmed the same with mild to moderate spinal stenosis at the C5-C6 level. He did not respond to gabapentin, declined any form of antidepressant type treatment though we did discuss how the mechanism may be beneficial to him and his neuropathic type pain. We did start Lyrica 50 at the last  visit he understands that we will be tapering up and that we will also not be using any opiate or opiate related controlled substances. With the findings on his cervical spine MRI he is interested in trying Depo-Medrol as trigger point injections rather than Kenalog as we have been using, I am happy to do this and use Depo-Medrol 80 or 2 cc of Depo-Medrol 40 which might be more beneficial to spread this out between his paracervical muscles. Ultimately we may consider referral for cervical epidural if none of this works.  I explained all of this in detail to [facility] and he is agreeable with the plan.   I discussed the above assessment and treatment plan with the patient. The patient was provided an opportunity to ask questions and all were answered. The patient agreed with the plan and demonstrated an understanding of the instructions.   The patient was advised to call back or seek an in-person evaluation if the symptoms worsen or if the condition fails to improve as anticipated.   I provided 30 minutes of face to face and non-face-to-face time during this encounter date, time was needed to gather information, review chart, records, communicate/coordinate with staff remotely, as well as complete documentation.   ___________________________________________ Ihor Austin. Benjamin Stain, M.D., ABFM., CAQSM. Primary Care and Sports Medicine [place] [facility]  Adjunct Professor of Family Medicine  [employer]

## 2020-09-14 NOTE — Assessment & Plan Note (Signed)
Kirk Williams returns in a virtual visit, he is a 34 year old male, he has known chronic neck pain, historically we have done bilateral paracervical trigger point injections, ultimately an x-ray showed C5-C6 DDD, we obtained an MRI that confirmed the same with mild to moderate spinal stenosis at the C5-C6 level. He did not respond to gabapentin, declined any form of antidepressant type treatment though we did discuss how the mechanism may be beneficial to him and his neuropathic type pain. We did start Lyrica 50 at the last visit he understands that we will be tapering up and that we will also not be using any opiate or opiate related controlled substances. With the findings on his cervical spine MRI he is interested in trying Depo-Medrol as trigger point injections rather than Kenalog as we have been using, I am happy to do this and use Depo-Medrol 80 or 2 cc of Depo-Medrol 40 which might be more beneficial to spread this out between his paracervical muscles. Ultimately we may consider referral for cervical epidural if none of this works.  I explained all of this in detail to [name] and he is agreeable with the plan.

## 2020-09-28 ENCOUNTER — Ambulatory Visit (INDEPENDENT_AMBULATORY_CARE_PROVIDER_SITE_OTHER): Payer: No Typology Code available for payment source | Admitting: Sports Medicine

## 2020-09-28 ENCOUNTER — Other Ambulatory Visit: Payer: Self-pay

## 2020-09-28 DIAGNOSIS — M47816 Spondylosis without myelopathy or radiculopathy, lumbar region: Secondary | ICD-10-CM

## 2020-09-28 DIAGNOSIS — M5030 Other cervical disc degeneration, unspecified cervical region: Secondary | ICD-10-CM

## 2020-09-28 MED ORDER — PREGABALIN 75 MG PO CAPS: 75.0000 mg | ORAL_CAPSULE | Freq: Two times a day (BID) | ORAL | 3 refills | Status: DC

## 2020-09-28 NOTE — Assessment & Plan Note (Signed)
Today we did trigger point injections into his left quadratus lumborum at the level of the iliac crest as well as bilateral parathoracic musculature for a total of #6 trigger point injections with Depo-Medrol, increasing Lyrica to 75 mg 2-3 times a day, return to see me as needed.

## 2020-09-28 NOTE — Progress Notes (Signed)
    Procedures performed today:    Procedure:  Injection of #6 trigger points Consent obtained and verified. Time-out conducted. Noted no overlying erythema, induration, or other signs of local infection. Skin prepped in a sterile fashion. Topical analgesic spray: Ethyl chloride. Completed without difficulty. Meds: A total of 1 cc Depo-Medrol 80 and approximately 4 cc lidocaine sprayed out between #6 trigger points including 4 along the parathoracic muscles and 2 along the left iliac crest and quadratus lumborum Advised to call if fevers/chills, erythema, induration, drainage, or persistent bleeding.  Independent interpretation of notes and tests performed by another provider:   None.  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    ___________________________________________ Ihor Austin. Benjamin Stain, M.D., ABFM., CAQSM. Primary Care and Sports Medicine [place] [facility]  Adjunct Instructor of Family Medicine  [employer]

## 2020-10-04 ENCOUNTER — Telehealth: Payer: Self-pay

## 2020-10-04 DIAGNOSIS — M5030 Other cervical disc degeneration, unspecified cervical region: Secondary | ICD-10-CM

## 2020-10-04 NOTE — Telephone Encounter (Signed)
It is $12-$17 with [facility], [facility] is always going to be a [facility].  Have him look on the [facility] website, find which pharmacy he would like me to send it to and then I can do it.

## 2020-10-04 NOTE — Telephone Encounter (Signed)
Patient called to report that the Lyrica is costing him $120 /month. Is there anything cheaper?

## 2020-10-06 NOTE — Telephone Encounter (Signed)
Left message for patient to return call.

## 2020-10-06 NOTE — Telephone Encounter (Signed)
Spoke with patient, he is going to use the [employer] and let us know which pharmacy he would like the prescription sent to.

## 2020-10-07 MED ORDER — PREGABALIN 75 MG PO CAPS: 75.0000 mg | ORAL_CAPSULE | Freq: Two times a day (BID) | ORAL | 3 refills | Status: DC

## 2020-10-07 NOTE — Telephone Encounter (Signed)
Done

## 2020-10-07 NOTE — Telephone Encounter (Signed)
Patient called back and requested the Lyrica prescription be sent to [facility] [address] in [place].  Pharmacy entered in patient's profile.

## 2020-11-02 ENCOUNTER — Telehealth: Payer: Self-pay

## 2020-11-02 NOTE — Telephone Encounter (Signed)
Patient was scheduled for his next set of injections on 11/30/20. Due to some scheeduling conflicts on his part, he has moved the appt to 11/25/20. Is this OK?

## 2020-11-02 NOTE — Telephone Encounter (Signed)
I think it is close enough to the original appointment where it will be fine.

## 2020-11-25 ENCOUNTER — Ambulatory Visit: Payer: No Typology Code available for payment source | Admitting: Sports Medicine

## 2020-11-30 ENCOUNTER — Ambulatory Visit: Payer: No Typology Code available for payment source | Admitting: Sports Medicine

## 2020-12-05 ENCOUNTER — Ambulatory Visit: Payer: No Typology Code available for payment source | Admitting: Sports Medicine

## 2020-12-07 ENCOUNTER — Telehealth: Payer: Self-pay | Admitting: Osteopathic Medicine

## 2020-12-07 ENCOUNTER — Other Ambulatory Visit: Payer: Self-pay

## 2020-12-07 ENCOUNTER — Ambulatory Visit (INDEPENDENT_AMBULATORY_CARE_PROVIDER_SITE_OTHER): Payer: No Typology Code available for payment source | Admitting: Sports Medicine

## 2020-12-07 DIAGNOSIS — M5030 Other cervical disc degeneration, unspecified cervical region: Secondary | ICD-10-CM

## 2020-12-07 MED ORDER — PREGABALIN 75 MG PO CAPS: 75.0000 mg | ORAL_CAPSULE | Freq: Two times a day (BID) | ORAL | 3 refills | Status: DC

## 2020-12-07 NOTE — Progress Notes (Signed)
    Procedures performed today:    Procedure:  Injection of #4 paracervical trigger points Consent obtained and verified. Time-out conducted. Noted no overlying erythema, induration, or other signs of local infection. Skin prepped in a sterile fashion. Topical analgesic spray: Ethyl chloride. Completed without difficulty. Meds: A total of 1.5 cc kenalog 40, 3.5 cc lidocaine sprayed out between the 4 paracervical trigger points. Advised to call if fevers/chills, erythema, induration, drainage, or persistent bleeding.  Independent interpretation of notes and tests performed by another provider:   None.  Brief History, Exam, Impression, and Recommendations:    DDD (degenerative disc disease), cervical Kirk Williams returns, he is a pleasant 35 year old male, known chronic neck pain, he does really well with bilateral paracervical trigger point injections, he does have some C5-C6 DDD on x-rays. MRI confirmed the same at C5-C6. Lyrica at the current dose works extremely well, we also did #4 trigger point injections into his bilateral paracervical muscles with a total of 1.5 cc kenalog 40. He is also doing acupuncture and is happy with how he feels, he can return to see me on an as-needed basis.    ___________________________________________ Kirk Williams. Kirk Williams, M.D., ABFM., CAQSM. Primary Care and Sports Medicine [place] [facility]  Adjunct Instructor of Family Medicine  [employer]

## 2020-12-07 NOTE — Assessment & Plan Note (Signed)
Kirk Williams returns, he is a pleasant 35 year old male, known chronic neck pain, he does really well with bilateral paracervical trigger point injections, he does have some C5-C6 DDD on x-rays. MRI confirmed the same at C5-C6. Lyrica at the current dose works extremely well, we also did #4 trigger point injections into his bilateral paracervical muscles with a total of 1.5 cc kenalog 40. He is also doing acupuncture and is happy with how he feels, he can return to see me on an as-needed basis.

## 2020-12-07 NOTE — Telephone Encounter (Signed)
Patient filled out his paper that was left in the accordion. Placed paper on your keyboard. AM

## 2020-12-16 ENCOUNTER — Telehealth: Payer: Self-pay | Admitting: Osteopathic Medicine

## 2020-12-16 NOTE — Telephone Encounter (Signed)
I received a request for amendment of health information.  I have put the request in Dr. Mardelle Matte box w/instructions.  Once completed, I will type up the approval or denial, have Dr. Lyn Hollingshead sign the form and will mail to [facility] with her decision.

## 2020-12-19 ENCOUNTER — Telehealth: Payer: Self-pay | Admitting: Osteopathic Medicine

## 2020-12-19 DIAGNOSIS — G901 Familial dysautonomia [Riley-Day]: Secondary | ICD-10-CM | POA: Insufficient documentation

## 2020-12-19 NOTE — Telephone Encounter (Signed)
Dysautonomia added to problem list  Pt had concerns about phrase "doubt anything going on in brain/spine" but there is no notation about the date of service or note in question. At any rate, this is not something I am going to change if other portions of assessment/plan are appropriate / if I acknowledge in the note that I planned an appropriate neurologic workup or referral to further investigate.

## 2020-12-28 ENCOUNTER — Ambulatory Visit: Payer: No Typology Code available for payment source | Admitting: Sports Medicine

## 2020-12-29 NOTE — Telephone Encounter (Signed)
Sent to [facility] 10 days ago Routing to [employer] also as [employer]

## 2020-12-30 NOTE — Telephone Encounter (Signed)
Dr. Lyn Hollingshead it is from date of service 06/15/20.  See noted below.  If you're not wanting to change anything, I will type up the denial letter for your to sign.  Please Advise!!  Patient Instructions  Plan: --> trial gabapentin as needed, 1 - 3 capsules at a time, up to 3 times per day, as needed for spasm/twitching.  --> if not better, will get MRI brain and c-spine, but I doubt anything going on  In brain, spinal cord. I think might be more muscular? Will see!  --> consider daily preventive treatment with Lyrica or something similar to Zoloft

## 2020-12-30 NOTE — Telephone Encounter (Signed)
Not going to change it - these were instructions printed for the patient explaining my medical decision-making, certainly planning to evaluate brain and spinal cord with MRI to confirm diagnosis if initial plan was not helping.   Technically, degenerative disc disease and spinal stenosis (his words) are issues of the musculoskeletal system, anatomically separate from brain/spinal cord so he had my words incorrect anyway when he claimed I wrote "spine" and I clearly wrote "spinal CORD", so I stand by my statement.

## 2021-01-09 ENCOUNTER — Ambulatory Visit: Payer: No Typology Code available for payment source | Admitting: Sports Medicine

## 2021-02-15 ENCOUNTER — Telehealth: Payer: Self-pay | Admitting: General Practice

## 2021-02-15 ENCOUNTER — Other Ambulatory Visit: Payer: Self-pay

## 2021-02-15 ENCOUNTER — Ambulatory Visit (INDEPENDENT_AMBULATORY_CARE_PROVIDER_SITE_OTHER): Payer: No Typology Code available for payment source | Admitting: Sports Medicine

## 2021-02-15 DIAGNOSIS — M47816 Spondylosis without myelopathy or radiculopathy, lumbar region: Secondary | ICD-10-CM

## 2021-02-15 DIAGNOSIS — M5030 Other cervical disc degeneration, unspecified cervical region: Secondary | ICD-10-CM | POA: Diagnosis not present

## 2021-02-15 MED ORDER — PREGABALIN 75 MG PO CAPS: 75.0000 mg | ORAL_CAPSULE | Freq: Two times a day (BID) | ORAL | 3 refills | Status: DC

## 2021-02-15 NOTE — Assessment & Plan Note (Addendum)
Today we did for trigger point injections, into the left and right quadratus lumborum as well as iliocostalis thoracis. Kenalog 40 used today, historically we have used Depo-Medrol. Refilling Lyrica. Return as needed.

## 2021-02-15 NOTE — Progress Notes (Signed)
    Procedures performed today:    Procedure:  Injection of #4 trigger points in the left and right quadratus lumborum and iliocostalis thoracis Consent obtained and verified. Time-out conducted. Noted no overlying erythema, induration, or other signs of local infection. Skin prepped in a sterile fashion. Topical analgesic spray: Ethyl chloride. Completed without difficulty. Meds: Advised to call if fevers/chills, erythema, induration, drainage, or persistent bleeding.  Independent interpretation of notes and tests performed by another provider:   None.  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 Kenalog 40 used today, historically we have used Depo-Medrol. Refilling Lyrica. Return as needed.    ___________________________________________ Ihor Austin. Benjamin Stain, M.D., ABFM., CAQSM. Primary Care and Sports Medicine [place] [facility]  Adjunct Instructor of Family Medicine  [employer]

## 2021-02-15 NOTE — Telephone Encounter (Signed)
Transition Care Management Unsuccessful Follow-up Telephone Call  Date of discharge and from where:  02/13/21 [facility]  Attempts:  1st Attempt  Reason for unsuccessful TCM follow-up call:  Left voice message

## 2021-02-16 NOTE — Telephone Encounter (Signed)
Transition Care Management Follow-up Telephone Call  Date of discharge and from where: 02/13/2021 from [facility]  How have you been since you were released from the hospital? Pt had PCP appointment on 02/15/2021

## 2021-03-29 ENCOUNTER — Ambulatory Visit (INDEPENDENT_AMBULATORY_CARE_PROVIDER_SITE_OTHER): Payer: No Typology Code available for payment source | Admitting: Sports Medicine

## 2021-03-29 ENCOUNTER — Other Ambulatory Visit: Payer: Self-pay

## 2021-03-29 DIAGNOSIS — M5030 Other cervical disc degeneration, unspecified cervical region: Secondary | ICD-10-CM

## 2021-03-29 MED ORDER — AMITRIPTYLINE HCL 50 MG PO TABS: ORAL_TABLET | ORAL | 3 refills | Status: DC

## 2021-03-29 NOTE — Assessment & Plan Note (Addendum)
Kirk Williams returns, he is a pleasant 35 year old male with chronic neck pain, C5-C6 DDD on x-rays, MRI confirmed the same C5-C6 DDD. He is doing well with Lyrica as well, approximately 4 months ago we did #4 trigger point injections to the bilateral paracervical musculature using 1.5 cc Kenalog 40 and lidocaine. In addition he would like to try amitriptyline instead of Lyrica, he will keep the Lyrica on hand in case amitriptyline does not provide sufficient relief. He is also doing acupuncture and is happy with the overall treatment plan, he is having some increasing pain and would like to do the above trigger point injections again, this is fine, return to see me as needed. This is a chronic process with exacerbation and pharmacologic intervention.

## 2021-03-29 NOTE — Progress Notes (Signed)
    Procedures performed today:    Procedure:  Injection of #4 paracervical trigger points Consent obtained and verified. Time-out conducted. Noted no overlying erythema, induration, or other signs of local infection. Skin prepped in a sterile fashion. Topical analgesic spray: Ethyl chloride. Completed without difficulty. Meds: A total of 1.5 cc kenalog 40, 3.5 cc lidocaine sprayed out between the 4 paracervical trigger points. Advised to call if fevers/chills, erythema, induration, drainage, or persistent bleeding.  Independent interpretation of notes and tests performed by another provider:   None.  Brief History, Exam, Impression, and Recommendations:    DDD (degenerative disc disease), cervical Kirk Williams returns, he is a pleasant 35 year old male with chronic neck pain, C5-C6 DDD on x-rays, MRI confirmed the same C5-C6 DDD. He is doing well with Lyrica as well, approximately 4 months ago we did #4 trigger point injections to the bilateral paracervical musculature using 1.5 cc Kenalog 40 and lidocaine. In addition he would like to try amitriptyline instead of Lyrica, he will keep the Lyrica on hand in case amitriptyline does not provide sufficient relief. He is also doing acupuncture and is happy with the overall treatment plan, he is having some increasing pain and would like to do the above trigger point injections again, this is fine, return to see me as needed. This is a chronic process with exacerbation and pharmacologic intervention.    ___________________________________________ Ihor Austin. Benjamin Stain, M.D., ABFM., CAQSM. Primary Care and Sports Medicine [place] [facility]  Adjunct Instructor of Family Medicine  [employer]

## 2021-06-14 ENCOUNTER — Ambulatory Visit: Payer: No Typology Code available for payment source | Admitting: Sports Medicine

## 2021-06-26 ENCOUNTER — Ambulatory Visit (INDEPENDENT_AMBULATORY_CARE_PROVIDER_SITE_OTHER): Payer: No Typology Code available for payment source | Admitting: Sports Medicine

## 2021-06-26 ENCOUNTER — Other Ambulatory Visit: Payer: Self-pay

## 2021-06-26 DIAGNOSIS — M47816 Spondylosis without myelopathy or radiculopathy, lumbar region: Secondary | ICD-10-CM | POA: Diagnosis not present

## 2021-06-26 NOTE — Assessment & Plan Note (Signed)
Kirk Williams returns, he is a pleasant 35 year old male, he has thoracolumbar DDD and lumbar facet arthropathy, historically has done well with trigger point injections rather than epidurals or facet injections, this was last performed in April of this year, I did his left and right quadratus lumborum as well as iliocostalis thoracis musculature bilaterally. Return as needed.

## 2021-06-26 NOTE — Progress Notes (Signed)
    Procedures performed today:    Procedure: Trigger point injections of the left and right quadratus lumborum and iliocostalis thoracis. Consent obtained and verified. Time-out conducted. Noted no overlying erythema, induration, or other signs of local infection. Skin prepped in a sterile fashion. Topical analgesic spray: Ethyl chloride. Completed without difficulty. Meds: 2 cc Kenalog 40 and 6 cc lidocaine spread out between the 4 trigger points. Advised to call if fevers/chills, erythema, induration, drainage, or persistent bleeding.  Independent interpretation of notes and tests performed by another provider:   None.  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 Return as needed.  Chronic process with exacerbation and pharmacologic intervention  ___________________________________________ Kirk Williams. Benjamin Stain, M.D., ABFM., CAQSM. Primary Care and Sports Medicine [place] [facility]  Adjunct Instructor of Family Medicine  [employer]

## 2021-08-09 ENCOUNTER — Ambulatory Visit (INDEPENDENT_AMBULATORY_CARE_PROVIDER_SITE_OTHER): Payer: No Typology Code available for payment source | Admitting: Sports Medicine

## 2021-08-09 ENCOUNTER — Ambulatory Visit (INDEPENDENT_AMBULATORY_CARE_PROVIDER_SITE_OTHER): Payer: No Typology Code available for payment source | Admitting: Family Medicine

## 2021-08-09 ENCOUNTER — Other Ambulatory Visit: Payer: Self-pay

## 2021-08-09 ENCOUNTER — Encounter: Payer: Self-pay | Admitting: Family Medicine

## 2021-08-09 DIAGNOSIS — N200 Calculus of kidney: Secondary | ICD-10-CM | POA: Diagnosis not present

## 2021-08-09 DIAGNOSIS — M47816 Spondylosis without myelopathy or radiculopathy, lumbar region: Secondary | ICD-10-CM | POA: Diagnosis not present

## 2021-08-09 DIAGNOSIS — R0981 Nasal congestion: Secondary | ICD-10-CM | POA: Diagnosis not present

## 2021-08-09 DIAGNOSIS — M5030 Other cervical disc degeneration, unspecified cervical region: Secondary | ICD-10-CM | POA: Diagnosis not present

## 2021-08-09 MED ORDER — AMITRIPTYLINE HCL 50 MG PO TABS: 50.0000 mg | ORAL_TABLET | Freq: Every day | ORAL | 3 refills | Status: DC

## 2021-08-09 MED ORDER — PREGABALIN 75 MG PO CAPS: 75.0000 mg | ORAL_CAPSULE | Freq: Two times a day (BID) | ORAL | 3 refills | Status: DC

## 2021-08-09 NOTE — Assessment & Plan Note (Signed)
He will continue to see Dr. Benjamin Stain for management of his thoracolumbar and cervical degenerative disc disease.

## 2021-08-09 NOTE — Progress Notes (Signed)
Kirk Williams - 35 y.o. male MRN [id number]  Date of birth: 1986/08/19  Subjective Chief Complaint  Patient presents with   Transfer    HPI Kirk Williams is a 35 year old male here today to reestablish care.  He is a former patient of Dr. Lyn Hollingshead however he did start seeing another primary care provider through [facility] and is now back to reestablish with [place].  He is currently seeing Dr. Benjamin Stain for management of chronic low back pain and neck pain related to degenerative disc disease.  He reports stable symptoms with current medications and plan in place by Dr. Benjamin Stain.  He did also have kidney stone requiring lithotripsy.  He does continue to see urology.  Stable symptoms at this time.  He does report some problems with postnasal drainage and morning cough.  He denies shortness of breath, wheezing or breathing difficulty.  He has had problems with allergies in the past.  ROS:  A comprehensive ROS was completed and negative except as noted per HPI  Allergies  Allergen Reactions   Amoxicillin Diarrhea and Nausea Only    Able to do liquid formula. Tabs make nauseated.  Doesn't take the tablets     Past Medical History:  Diagnosis Date   Carpal tunnel syndrome    Cervical radiculopathy    Lumbar radiculopathy    Mid back pain     Past Surgical History:  Procedure Laterality Date   TONSILLECTOMY      Social History   Socioeconomic History   Marital status: Single    Spouse name: Not on file   Number of children: Not on file   Years of education: Not on file   Highest education level: Not on file  Occupational History   Not on file  Tobacco Use   Smoking status: Some Days    Types: Cigarettes   Smokeless tobacco: Never   Tobacco comments:    5-6 cigs a day  Vaping Use   Vaping Use: Never used  Substance and Sexual Activity   Alcohol use: Never   Drug use: Never   Sexual activity: Not Currently    Partners: Female, Male  Other Topics Concern   Not on  file  Social History Narrative   Not on file   Social Determinants of Health   Financial Resource Strain: Not on file  Food Insecurity: Not on file  Transportation Needs: Not on file  Physical Activity: Not on file  Stress: Not on file  Social Connections: Not on file    Family History  Problem Relation Age of Onset   Scoliosis Mother    Fibromyalgia Mother    High blood pressure Father    High Cholesterol Father        Nelia Shi Syndrome    Health Maintenance  Topic Date Due   COVID-19 Vaccine (1) Never done   HIV Screening  Never done   Hepatitis C Screening  Never done   INFLUENZA VACCINE  01/26/2022 (Originally 05/29/2021)   TETANUS/TDAP  06/13/2023   HPV VACCINES  Aged Out     ----------------------------------------------------------------------------------------------------------------------------------------------------------------------------------------------------------------- Physical Exam BP (!) 154/90 (BP Location: Left Arm, Patient Position: Sitting, Cuff Size: Normal)   Pulse (!) 103   Temp 98.2 F (36.8 C)   Ht 5\' 10"  (1.778 m)   Wt 183 lb 6.4 oz (83.2 kg)   SpO2 100%   BMI 26.32 kg/m   Physical Exam Constitutional:      Appearance: Normal appearance.  HENT:  Head: Normocephalic and atraumatic.  Neurological:     General: No focal deficit present.     Mental Status: He is alert.  Psychiatric:        Mood and Affect: Mood normal.        Behavior: Behavior normal.    ------------------------------------------------------------------------------------------------------------------------------------------------------------------------------------------------------------------- Assessment and Plan  Nephrolithiasis Recent lithotripsy which he tolerated fairly well.  He will continue to follow with urology for management of this.  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  Benjamin Stain for management of his thoracolumbar and cervical degenerative disc disease.  Nasal congestion Recommend adding either cetirizine or fexofenadine.  Saline rinses and/or addition of Flonase recommended.  Home humidifier may be helpful as well.   No orders of the defined types were placed in this encounter.   No follow-ups on file.    This visit occurred during the SARS-CoV-2 public health emergency.  Safety protocols were in place, including screening questions prior to the visit, additional usage of staff PPE, and extensive cleaning of exam room while observing appropriate contact time as indicated for disinfecting solutions.

## 2021-08-09 NOTE — Assessment & Plan Note (Signed)
Recent lithotripsy which he tolerated fairly well.  He will continue to follow with urology for management of this.

## 2021-08-09 NOTE — Patient Instructions (Addendum)
Nice to meet you today! Try cetirizine (zyrtec) or fexofenadine (allegra) Try nasal saline or flonase Humidifier at home may be helpful

## 2021-08-09 NOTE — Assessment & Plan Note (Addendum)
Recommend adding either cetirizine or fexofenadine.  Saline rinses and/or addition of Flonase recommended.  Home humidifier may be helpful as well.

## 2021-08-09 NOTE — Progress Notes (Signed)
    Procedures performed today:    Procedure:  Injection of #4 paracervical trigger points Consent obtained and verified. Time-out conducted. Noted no overlying erythema, induration, or other signs of local infection. Skin prepped in a sterile fashion. Topical analgesic spray: Ethyl chloride. Completed without difficulty. Meds: 1 cc kenalog 40, 4 cc lidocaine spread out between the 4 trigger points. Advised to call if fevers/chills, erythema, induration, drainage, or persistent bleeding.  Independent interpretation of notes and tests performed by another provider:   None.  Brief History, Exam, Impression, and Recommendations:    DDD (degenerative disc disease), cervical This is a very pleasant 35 year old male, chronic neck pain, C5-C6 DDD on x-rays, MRI confirms C5-C6 DDD, historically has done well with Lyrica, amitriptyline, occasional trigger point injections last performed in June. Today we did repeat trigger point injections with kenalog 40, we did 4 injections into the bilateral paracervical musculature. Continue Lyrica, amitriptyline, refilling both of these. Return as needed.    ___________________________________________ Ihor Austin. Benjamin Stain, M.D., ABFM., CAQSM. Primary Care and Sports Medicine [place] [facility]  Adjunct Instructor of Family Medicine  [employer]

## 2021-08-09 NOTE — Assessment & Plan Note (Signed)
This is a very pleasant 35 year old male, chronic neck pain, C5-C6 DDD on x-rays, MRI confirms C5-C6 DDD, historically has done well with Lyrica, amitriptyline, occasional trigger point injections last performed in June. Today we did repeat trigger point injections with kenalog 40, we did 4 injections into the bilateral paracervical musculature. Continue Lyrica, amitriptyline, refilling both of these. Return as needed.

## 2021-08-10 ENCOUNTER — Encounter: Payer: Self-pay | Admitting: Family Medicine

## 2021-09-18 ENCOUNTER — Telehealth: Payer: Self-pay | Admitting: Family Medicine

## 2021-09-18 DIAGNOSIS — Z0000 Encounter for general adult medical examination without abnormal findings: Secondary | ICD-10-CM

## 2021-09-18 DIAGNOSIS — G901 Familial dysautonomia [Riley-Day]: Secondary | ICD-10-CM

## 2021-09-18 DIAGNOSIS — E559 Vitamin D deficiency, unspecified: Secondary | ICD-10-CM

## 2021-09-18 NOTE — Telephone Encounter (Signed)
Patient called and said he was last seen in October and he hasn't had blood work done in a while and wanted to know if he could have lab orders put in.

## 2021-09-19 ENCOUNTER — Encounter: Payer: Self-pay | Admitting: Family Medicine

## 2021-09-19 NOTE — Telephone Encounter (Signed)
Pt called requesting labs prior to visit.  Sent for your approval. Please add Dx associations.

## 2021-09-19 NOTE — Telephone Encounter (Signed)
Orders signed.

## 2021-09-20 NOTE — Telephone Encounter (Signed)
Lab orders are in.  He will need appt with me to follow up on these once completed.   Thanks!

## 2021-09-30 LAB — LIPID PANEL
Cholesterol: 221 mg/dL — ABNORMAL HIGH (ref <200)
HDL: 38 mg/dL — ABNORMAL LOW (ref >=40)
LDL Cholesterol (Calc): 141 mg/dL (calc) — ABNORMAL HIGH
Non-HDL Cholesterol (Calc): 183 mg/dL (calc) — ABNORMAL HIGH (ref <130)
Total CHOL/HDL Ratio: 5.8 (calc) — ABNORMAL HIGH (ref <5.0)
Triglycerides: 294 mg/dL — ABNORMAL HIGH (ref <150)

## 2021-09-30 LAB — CBC
HCT: 45.3 % (ref 38.5–50.0)
Hemoglobin: 15.7 g/dL (ref 13.2–17.1)
MCH: 31.1 pg (ref 27.0–33.0)
MCHC: 34.7 g/dL (ref 32.0–36.0)
MCV: 89.7 fL (ref 80.0–100.0)
MPV: 13.5 fL — ABNORMAL HIGH (ref 7.5–12.5)
Platelets: 213 10*3/uL (ref 140–400)
RBC: 5.05 10*6/uL (ref 4.20–5.80)
RDW: 11.4 % (ref 11.0–15.0)
WBC: 7.9 10*3/uL (ref 3.8–10.8)

## 2021-09-30 LAB — TSH: TSH: 1.48 mIU/L (ref 0.40–4.50)

## 2021-09-30 LAB — VITAMIN D 25 HYDROXY (VIT D DEFICIENCY, FRACTURES): Vit D, 25-Hydroxy: 26 ng/mL — ABNORMAL LOW (ref 30–100)

## 2021-09-30 LAB — BASIC METABOLIC PANEL
BUN/Creatinine Ratio: 9 (calc) (ref 6–22)
BUN: 13 mg/dL (ref 7–25)
CO2: 28 mmol/L (ref 20–32)
Calcium: 10 mg/dL (ref 8.6–10.3)
Chloride: 103 mmol/L (ref 98–110)
Creat: 1.46 mg/dL — ABNORMAL HIGH (ref 0.60–1.26)
Glucose, Bld: 96 mg/dL (ref 65–139)
Potassium: 4.3 mmol/L (ref 3.5–5.3)
Sodium: 140 mmol/L (ref 135–146)

## 2021-09-30 LAB — VITAMIN B12: Vitamin B-12: 308 pg/mL (ref 200–1100)

## 2021-10-05 ENCOUNTER — Other Ambulatory Visit: Payer: Self-pay | Admitting: Family Medicine

## 2021-10-05 DIAGNOSIS — R7989 Other specified abnormal findings of blood chemistry: Secondary | ICD-10-CM

## 2021-10-08 ENCOUNTER — Encounter: Payer: Self-pay | Admitting: Family Medicine

## 2021-10-13 ENCOUNTER — Other Ambulatory Visit: Payer: Self-pay

## 2021-10-13 ENCOUNTER — Ambulatory Visit (INDEPENDENT_AMBULATORY_CARE_PROVIDER_SITE_OTHER): Payer: No Typology Code available for payment source | Admitting: Sports Medicine

## 2021-10-13 DIAGNOSIS — M47816 Spondylosis without myelopathy or radiculopathy, lumbar region: Secondary | ICD-10-CM | POA: Diagnosis not present

## 2021-10-13 NOTE — Assessment & Plan Note (Signed)
Farah returns, he is a pleasant 35 year old male, thoracolumbar DDD with lumbar facet arthropathy, historically has done well with trigger point injections rather than epidurals or facet joint injections, last performed at the end of August, we did his left and right quadratus lumborum as well as iliocostalis thoracic musculature bilaterally, repeated today.

## 2021-10-13 NOTE — Progress Notes (Signed)
° ° °  Procedures performed today:    Procedure: Trigger point injections of the left and right quadratus lumborum and iliocostalis thoracis. Consent obtained and verified. Time-out conducted. Noted no overlying erythema, induration, or other signs of local infection. Skin prepped in a sterile fashion. Topical analgesic spray: Ethyl chloride. Completed without difficulty. Meds: 1 cc Kenalog 40 and 5 cc lidocaine spread out between the 4 trigger points. Advised to call if fevers/chills, erythema, induration, drainage, or persistent bleeding.  Independent interpretation of notes and tests performed by another provider:   None.  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  Chronic process with exacerbation and pharmacologic intervention.  ___________________________________________ Kirk Williams. Kirk Williams, M.D., ABFM., CAQSM. Primary Care and Sports Medicine [place] [facility]  Adjunct Instructor of Family Medicine  [employer]

## 2021-10-16 ENCOUNTER — Ambulatory Visit: Payer: No Typology Code available for payment source | Admitting: Sports Medicine

## 2021-10-31 ENCOUNTER — Ambulatory Visit: Payer: No Typology Code available for payment source | Admitting: Family Medicine

## 2021-11-21 ENCOUNTER — Encounter: Payer: Self-pay | Admitting: Family Medicine

## 2021-12-29 ENCOUNTER — Ambulatory Visit: Payer: No Typology Code available for payment source | Admitting: Sports Medicine

## 2022-01-01 ENCOUNTER — Other Ambulatory Visit: Payer: Self-pay

## 2022-01-01 ENCOUNTER — Ambulatory Visit (INDEPENDENT_AMBULATORY_CARE_PROVIDER_SITE_OTHER): Payer: No Typology Code available for payment source | Admitting: Sports Medicine

## 2022-01-01 DIAGNOSIS — M5030 Other cervical disc degeneration, unspecified cervical region: Secondary | ICD-10-CM | POA: Diagnosis not present

## 2022-01-01 NOTE — Assessment & Plan Note (Signed)
Pleasant 36 year old male, chronic neck pain with C5-C6 DDD on x-rays and MRI, historically did well with Lyrica, amitriptyline and occasional trigger point injections, we last did the trigger point injections in October 2022, repeat bilateral paracervical trigger point injections, return as needed. ?

## 2022-01-01 NOTE — Progress Notes (Signed)
? ? ?  Procedures performed today:   ? ?Procedure:  Injection of #4 paracervical trigger points ?Consent obtained and verified. ?Time-out conducted. ?Noted no overlying erythema, induration, or other signs of local infection. ?Skin prepped in a sterile fashion. ?Topical analgesic spray: Ethyl chloride. ?Completed without difficulty. ?Meds: 1 cc kenalog 40, 4 cc lidocaine spread out between the 4 trigger points. ?Advised to call if fevers/chills, erythema, induration, drainage, or persistent bleeding. ? ?Independent interpretation of notes and tests performed by another provider:  ? ?None. ? ?Brief History, Exam, Impression, and Recommendations:   ? ?DDD (degenerative disc disease), cervical ?Pleasant 36 year old male, chronic neck pain with C5-C6 DDD on x-rays and MRI, historically did well with Lyrica, amitriptyline and occasional trigger point injections, we last did the trigger point injections in October 2022, repeat bilateral paracervical trigger point injections, return as needed. ? ?Chronic process with exacerbation and pharmacologic intervention ? ?___________________________________________ ?Gwen Her. Dianah Field, M.D., ABFM., CAQSM. ?Primary Care and Sports Medicine ?[place] ? ?Adjunct Instructor of Family Medicine  ?[employer] ?

## 2022-01-02 LAB — BASIC METABOLIC PANEL
BUN/Creatinine Ratio: 11 (calc) (ref 6–22)
BUN: 15 mg/dL (ref 7–25)
CO2: 27 mmol/L (ref 20–32)
Calcium: 10.1 mg/dL (ref 8.6–10.3)
Chloride: 105 mmol/L (ref 98–110)
Creat: 1.4 mg/dL — ABNORMAL HIGH (ref 0.60–1.26)
Glucose, Bld: 116 mg/dL — ABNORMAL HIGH (ref 65–99)
Potassium: 4.4 mmol/L (ref 3.5–5.3)
Sodium: 140 mmol/L (ref 135–146)

## 2022-01-05 ENCOUNTER — Telehealth: Payer: Self-pay

## 2022-01-05 ENCOUNTER — Other Ambulatory Visit: Payer: Self-pay | Admitting: Family Medicine

## 2022-01-05 DIAGNOSIS — N1831 Chronic kidney disease, stage 3a: Secondary | ICD-10-CM

## 2022-01-05 NOTE — Telephone Encounter (Signed)
Doc,  ? ?I just saw the labs and MyChart message sent to the patient. He seemed very agitated on the phone about the referral.  ? ?Chrissie Noa  ?

## 2022-01-05 NOTE — Telephone Encounter (Signed)
Doc,  ? ?Patient called to ask why we referred him to Nephrology. I was unable to see any notes related to this. He is being seen by urology and has had some elevated creatinine levels and normal UA's. I saw the diagnosis code used was Stage 3 Kidney disease for the referral. The patient denied that diagnosis. Please advise. ? ?Very Respectfully,  ?Gwyndolyn Saxon   ?

## 2022-01-06 ENCOUNTER — Encounter: Payer: Self-pay | Admitting: Family Medicine

## 2022-03-23 ENCOUNTER — Other Ambulatory Visit: Payer: Self-pay

## 2022-03-23 DIAGNOSIS — M5030 Other cervical disc degeneration, unspecified cervical region: Secondary | ICD-10-CM

## 2022-03-23 MED ORDER — PREGABALIN 75 MG PO CAPS: 75.0000 mg | ORAL_CAPSULE | Freq: Two times a day (BID) | ORAL | 3 refills | Status: DC

## 2022-03-23 MED ORDER — AMITRIPTYLINE HCL 50 MG PO TABS: 50.0000 mg | ORAL_TABLET | Freq: Every day | ORAL | 3 refills | Status: DC

## 2022-03-23 NOTE — Telephone Encounter (Signed)
Patient bought a house and moved to [place]. He requested his prescriptions be sent to [facility]. He did schedule a follow up appt.

## 2022-03-27 ENCOUNTER — Other Ambulatory Visit: Payer: Self-pay

## 2022-03-27 DIAGNOSIS — M5030 Other cervical disc degeneration, unspecified cervical region: Secondary | ICD-10-CM

## 2022-03-27 MED ORDER — PREGABALIN 75 MG PO CAPS: 75.0000 mg | ORAL_CAPSULE | Freq: Two times a day (BID) | ORAL | 3 refills | Status: DC

## 2022-03-27 MED ORDER — AMITRIPTYLINE HCL 50 MG PO TABS
50.0000 mg | ORAL_TABLET | Freq: Every day | ORAL | 3 refills | Status: DC
Start: 2022-03-27 — End: 2023-04-26

## 2022-03-27 NOTE — Telephone Encounter (Signed)
Prescriptions were sent to the wrong pharmacy last week. These prescriptions were canceled.

## 2022-04-02 ENCOUNTER — Ambulatory Visit (INDEPENDENT_AMBULATORY_CARE_PROVIDER_SITE_OTHER): Payer: No Typology Code available for payment source | Admitting: Sports Medicine

## 2022-04-02 DIAGNOSIS — M47816 Spondylosis without myelopathy or radiculopathy, lumbar region: Secondary | ICD-10-CM | POA: Diagnosis not present

## 2022-04-02 NOTE — Progress Notes (Signed)
    Procedures performed today:    Procedure: Trigger point injections of the left and right quadratus lumborum and iliocostalis thoracis. Consent obtained and verified. Time-out conducted. Noted no overlying erythema, induration, or other signs of local infection. Skin prepped in a sterile fashion. Topical analgesic spray: Ethyl chloride. Completed without difficulty. Meds: 1 cc Kenalog 40 and 5 cc lidocaine spread out between the 4 trigger points. Advised to call if fevers/chills, erythema, induration, drainage, or persistent bleeding.  Independent interpretation of notes and tests performed by another provider:   None.  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 Repeated today.  Chronic process with exacerbation and pharmacologic intervention.  ___________________________________________ Kirk Williams. Benjamin Stain, M.D., ABFM., CAQSM. Primary Care and Sports Medicine [place] [facility]  Adjunct Instructor of Family Medicine  [employer]

## 2022-04-02 NOTE — Assessment & Plan Note (Signed)
Pleasant 36 year old male, thoracolumbar DDD with lumbar facet arthropathy, historically has done well with trigger point injections rather than epidurals or facets, last trigger point injections were performed in December 2022, bilateral quadratus lumborum as well as iliocostalis thoracis musculature. Repeated today.

## 2022-06-29 ENCOUNTER — Ambulatory Visit (INDEPENDENT_AMBULATORY_CARE_PROVIDER_SITE_OTHER): Payer: No Typology Code available for payment source | Admitting: Sports Medicine

## 2022-06-29 DIAGNOSIS — M5030 Other cervical disc degeneration, unspecified cervical region: Secondary | ICD-10-CM | POA: Diagnosis not present

## 2022-06-29 NOTE — Progress Notes (Signed)
    Procedures performed today:    Procedure:  Injection of #4 paracervical trigger points Consent obtained and verified. Time-out conducted. Noted no overlying erythema, induration, or other signs of local infection. Skin prepped in a sterile fashion. Topical analgesic spray: Ethyl chloride. Completed without difficulty. Meds: 1 cc kenalog 40, 4 cc lidocaine spread out between the 4 trigger points. Advised to call if fevers/chills, erythema, induration, drainage, or persistent bleeding.  Independent interpretation of notes and tests performed by another provider:   None.  Brief History, Exam, Impression, and Recommendations:    DDD (degenerative disc disease), cervical This is a pleasant 36 year old male, chronic neck pain with C5-C6 DDD on x-rays and MRI, historically did well with Lyrica, amitriptyline and occasional trigger point injections, his last set of bilateral trigger point injections x4 were in March 2023, repeated today with triamcinolone. Return to see me as needed for this.  Chronic process with exacerbation and pharmacologic intervention  ____________________________________________ Ihor Austin. Benjamin Stain, M.D., ABFM., CAQSM., AME. Primary Care and Sports Medicine [place] [facility]  Adjunct Professor of Family Medicine  [employer] [facility] [employer] [facility] [employer]  Restaurant manager, fast food

## 2022-06-29 NOTE — Assessment & Plan Note (Signed)
This is a pleasant 36 year old male, chronic neck pain with C5-C6 DDD on x-rays and MRI, historically did well with Lyrica, amitriptyline and occasional trigger point injections, his last set of bilateral trigger point injections x4 were in March 2023, repeated today with triamcinolone. Return to see me as needed for this.

## 2022-10-04 ENCOUNTER — Encounter: Payer: Self-pay | Admitting: Sports Medicine

## 2023-01-11 ENCOUNTER — Ambulatory Visit: Payer: Self-pay | Admitting: Sports Medicine

## 2023-01-15 ENCOUNTER — Ambulatory Visit (INDEPENDENT_AMBULATORY_CARE_PROVIDER_SITE_OTHER): Payer: Self-pay | Admitting: Sports Medicine

## 2023-01-15 DIAGNOSIS — M47816 Spondylosis without myelopathy or radiculopathy, lumbar region: Secondary | ICD-10-CM

## 2023-01-15 NOTE — Assessment & Plan Note (Signed)
Pleasant 37 year old male, thoracolumbar DDD with lumbar facet arthropathy, historically has done really well with trigger point injections rather than epidurals or facets, the last trigger point injections into the thoracolumbar musculature was done April 02, 2022, we did bilateral quadratus lumborum as well as iliocostalis thoracic musculature. Return to see me as needed.

## 2023-01-15 NOTE — Progress Notes (Signed)
    Procedures performed today:    Procedure: Trigger point injections of the left and right quadratus lumborum and iliocostalis thoracis. Consent obtained and verified. Time-out conducted. Noted no overlying erythema, induration, or other signs of local infection. Skin prepped in a sterile fashion. Topical analgesic spray: Ethyl chloride. Completed without difficulty. Meds: 1 cc Kenalog 40 and 5 cc lidocaine spread out between the 4 trigger points. Advised to call if fevers/chills, erythema, induration, drainage, or persistent bleeding.   Independent interpretation of notes and tests performed by another provider:   None.  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 Return to see me as needed.    ____________________________________________ Gwen Her. Dianah Field, M.D., ABFM., CAQSM., AME. Primary Care and Sports Medicine [place] [facility]  Adjunct Professor of [place] [employer] [facility] [employer]  Risk manager

## 2023-04-26 ENCOUNTER — Ambulatory Visit: Payer: Self-pay | Admitting: Sports Medicine

## 2023-04-26 DIAGNOSIS — M5030 Other cervical disc degeneration, unspecified cervical region: Secondary | ICD-10-CM

## 2023-04-26 MED ORDER — AMITRIPTYLINE HCL 50 MG PO TABS: 50.0000 mg | ORAL_TABLET | Freq: Every day | ORAL | 3 refills | Status: AC

## 2023-04-26 NOTE — Progress Notes (Signed)
    Procedures performed today:    Procedure:  Injection of #4 paracervical trigger points Consent obtained and verified. Time-out conducted. Noted no overlying erythema, induration, or other signs of local infection. Skin prepped in a sterile fashion. Topical analgesic spray: Ethyl chloride. Completed without difficulty. Meds: 1 cc kenalog 40, 4 cc lidocaine spread out between the 4 trigger points. Advised to call if fevers/chills, erythema, induration, drainage, or persistent bleeding.   Independent interpretation of notes and tests performed by another provider:   None.  Brief History, Exam, Impression, and Recommendations:    DDD (degenerative disc disease), cervical Pleasant 37 year old male, chronic neck pain with C5-C6 DDD on x-rays and MRI, historically did well with Lyrica, he is off of Lyrica now, amitriptyline seems to be working well, refilling this, last set of trigger point injections were in September 2023, repeat paracervical trigger point injections today, return to see me as needed.    ____________________________________________ Ihor Austin. Benjamin Stain, M.D., ABFM., CAQSM., AME. Primary Care and Sports Medicine [place] [facility]  Adjunct Professor of Family Medicine  [employer] [facility] [employer] [facility] [employer]  Restaurant manager, fast food

## 2023-04-26 NOTE — Assessment & Plan Note (Signed)
Pleasant 37 year old male, chronic neck pain with C5-C6 DDD on x-rays and MRI, historically did well with Lyrica, he is off of Lyrica now, amitriptyline seems to be working well, refilling this, last set of trigger point injections were in September 2023, repeat paracervical trigger point injections today, return to see me as needed.

## 2023-11-26 ENCOUNTER — Ambulatory Visit (INDEPENDENT_AMBULATORY_CARE_PROVIDER_SITE_OTHER): Payer: Self-pay | Admitting: Sports Medicine

## 2023-11-26 DIAGNOSIS — M5030 Other cervical disc degeneration, unspecified cervical region: Secondary | ICD-10-CM

## 2023-11-26 DIAGNOSIS — M47816 Spondylosis without myelopathy or radiculopathy, lumbar region: Secondary | ICD-10-CM

## 2023-11-26 NOTE — Assessment & Plan Note (Addendum)
Pleasant 38 year old male, thoracolumbar DDD with lumbar facet arthropathy, has historically done well with trigger point injections rather than epidurals or facets, the last set of trigger point injections were in March 2024, bilateral quadratus lumborum as well as iliocostalis thoracis musculature. Continue amitriptyline, may increase from as needed to nightly if insufficient improvement in pain. Return to see me as needed.

## 2023-11-26 NOTE — Assessment & Plan Note (Addendum)
As below pleasant 38-year-old male with chronic neck pain, C5-C6 DDD on x-rays and MRI, off of Lyrica, amitriptyline seems to be working well, his last set of trigger point injections were in June 2024, repeat bilateral paracervical trigger point injections today. Continue amitriptyline, may increase from as needed to nightly if insufficient improvement in pain.

## 2023-11-26 NOTE — Progress Notes (Signed)
    Procedures performed today:    Procedure:  Injection of #4 paracervical trigger points Consent obtained and verified. Time-out conducted. Noted no overlying erythema, induration, or other signs of local infection. Skin prepped in a sterile fashion. Topical analgesic spray: Ethyl chloride. Completed without difficulty. Meds: 1 cc kenalog 40, 4 cc lidocaine spread out between the 4 trigger points. Advised to call if fevers/chills, erythema, induration, drainage, or persistent bleeding.   Procedure:  Injection of #4 paralumbar trigger points Consent obtained and verified. Time-out conducted. Noted no overlying erythema, induration, or other signs of local infection. Skin prepped in a sterile fashion. Topical analgesic spray: Ethyl chloride. Completed without difficulty. Meds: 1 cc kenalog 40, 4 cc lidocaine spread out between the 4 trigger points. Advised to call if fevers/chills, erythema, induration, drainage, or persistent bleeding.   Independent interpretation of notes and tests performed by another provider:   None.  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 Continue amitriptyline, may increase from as needed to nightly if insufficient improvement in pain. Return to see me as needed.  DDD (degenerative disc disease), cervical As below pleasant 61-year-old male with chronic neck pain, C5-C6 DDD on x-rays and MRI, off of Lyrica, amitriptyline seems to be working well, his last set of trigger point injections were in June 2024, repeat bilateral paracervical trigger point injections today. Continue  amitriptyline, may increase from as needed to nightly if insufficient improvement in pain.    ____________________________________________ Kirk Williams. Benjamin Stain, M.D., ABFM., CAQSM., AME. Primary Care and Sports Medicine [place] [facility]  Adjunct Professor of Family Medicine  [place] [employer] [facility] [facility] [employer]  Restaurant manager, fast food

## 2024-06-25 ENCOUNTER — Ambulatory Visit: Payer: Self-pay | Admitting: Sports Medicine

## 2024-06-30 ENCOUNTER — Encounter: Payer: Self-pay | Admitting: Sports Medicine
